# Patient Record
Sex: Female | Born: 1960
Health system: Southern US, Community
[De-identification: ages and names within clinical notes are randomized; demographics above are authoritative.]

## PROBLEM LIST (undated history)

## (undated) DIAGNOSIS — I10 Essential (primary) hypertension: Secondary | ICD-10-CM

## (undated) DIAGNOSIS — D219 Benign neoplasm of connective and other soft tissue, unspecified: Secondary | ICD-10-CM

## (undated) DIAGNOSIS — N83202 Unspecified ovarian cyst, left side: Secondary | ICD-10-CM

## (undated) DIAGNOSIS — M722 Plantar fascial fibromatosis: Secondary | ICD-10-CM

## (undated) DIAGNOSIS — M1990 Unspecified osteoarthritis, unspecified site: Secondary | ICD-10-CM

## (undated) DIAGNOSIS — B009 Herpesviral infection, unspecified: Secondary | ICD-10-CM

## (undated) DIAGNOSIS — R87619 Unspecified abnormal cytological findings in specimens from cervix uteri: Secondary | ICD-10-CM

## (undated) DIAGNOSIS — R87629 Unspecified abnormal cytological findings in specimens from vagina: Secondary | ICD-10-CM

## (undated) DIAGNOSIS — IMO0002 Reserved for concepts with insufficient information to code with codable children: Secondary | ICD-10-CM

## (undated) DIAGNOSIS — J45990 Exercise induced bronchospasm: Secondary | ICD-10-CM

## (undated) DIAGNOSIS — N83201 Unspecified ovarian cyst, right side: Secondary | ICD-10-CM

## (undated) DIAGNOSIS — R9389 Abnormal findings on diagnostic imaging of other specified body structures: Secondary | ICD-10-CM

## (undated) HISTORY — DX: Benign neoplasm of connective and other soft tissue, unspecified: D21.9

## (undated) HISTORY — DX: Unspecified ovarian cyst, right side: N83.201

## (undated) HISTORY — DX: Abnormal findings on diagnostic imaging of other specified body structures: R93.89

## (undated) HISTORY — DX: Unspecified ovarian cyst, left side: N83.202

## (undated) HISTORY — DX: Herpesviral infection, unspecified: B00.9

## (undated) HISTORY — PX: RETINAL LASER PROCEDURE: SHX2339

## (undated) HISTORY — DX: Plantar fascial fibromatosis: M72.2

## (undated) HISTORY — DX: Exercise induced bronchospasm: J45.990

## (undated) HISTORY — PX: LASER ABLATION OF THE CERVIX: SHX1949

## (undated) HISTORY — PX: OTHER SURGICAL HISTORY: SHX169

## (undated) HISTORY — DX: Unspecified abnormal cytological findings in specimens from vagina: R87.629

## (undated) HISTORY — DX: Reserved for concepts with insufficient information to code with codable children: IMO0002

## (undated) HISTORY — PX: CARPAL TUNNEL RELEASE: SHX101

## (undated) HISTORY — DX: Essential (primary) hypertension: I10

## (undated) HISTORY — DX: Unspecified abnormal cytological findings in specimens from cervix uteri: R87.619

---

## 1998-01-27 ENCOUNTER — Encounter: Admission: RE | Admit: 1998-01-27 | Discharge: 1998-04-27 | Payer: Self-pay | Admitting: *Deleted

## 2001-10-20 ENCOUNTER — Other Ambulatory Visit: Admission: RE | Admit: 2001-10-20 | Discharge: 2001-10-20 | Payer: Self-pay | Admitting: *Deleted

## 2003-01-10 ENCOUNTER — Other Ambulatory Visit: Admission: RE | Admit: 2003-01-10 | Discharge: 2003-01-10 | Payer: Self-pay | Admitting: *Deleted

## 2003-01-26 ENCOUNTER — Encounter: Payer: Self-pay | Admitting: *Deleted

## 2003-01-26 ENCOUNTER — Ambulatory Visit (HOSPITAL_COMMUNITY): Admission: RE | Admit: 2003-01-26 | Discharge: 2003-01-26 | Payer: Self-pay | Admitting: *Deleted

## 2004-01-17 ENCOUNTER — Other Ambulatory Visit: Admission: RE | Admit: 2004-01-17 | Discharge: 2004-01-17 | Payer: Self-pay | Admitting: *Deleted

## 2004-10-01 ENCOUNTER — Ambulatory Visit: Payer: Self-pay | Admitting: Sports Medicine

## 2005-01-21 ENCOUNTER — Other Ambulatory Visit: Admission: RE | Admit: 2005-01-21 | Discharge: 2005-01-21 | Payer: Self-pay | Admitting: *Deleted

## 2005-02-27 ENCOUNTER — Ambulatory Visit (HOSPITAL_COMMUNITY): Admission: RE | Admit: 2005-02-27 | Discharge: 2005-02-27 | Payer: Self-pay | Admitting: *Deleted

## 2006-03-31 ENCOUNTER — Ambulatory Visit (HOSPITAL_COMMUNITY): Admission: RE | Admit: 2006-03-31 | Discharge: 2006-03-31 | Payer: Self-pay | Admitting: Obstetrics and Gynecology

## 2007-04-13 ENCOUNTER — Ambulatory Visit (HOSPITAL_COMMUNITY): Admission: RE | Admit: 2007-04-13 | Discharge: 2007-04-13 | Payer: Self-pay | Admitting: Obstetrics and Gynecology

## 2007-07-05 ENCOUNTER — Ambulatory Visit: Payer: Self-pay | Admitting: Orthopedic Surgery

## 2007-07-19 ENCOUNTER — Ambulatory Visit: Payer: Self-pay | Admitting: Orthopedic Surgery

## 2007-08-16 ENCOUNTER — Ambulatory Visit: Payer: Self-pay | Admitting: Orthopedic Surgery

## 2007-09-15 ENCOUNTER — Telehealth (INDEPENDENT_AMBULATORY_CARE_PROVIDER_SITE_OTHER): Payer: Self-pay | Admitting: *Deleted

## 2007-09-15 ENCOUNTER — Ambulatory Visit: Payer: Self-pay | Admitting: Orthopedic Surgery

## 2007-10-07 ENCOUNTER — Other Ambulatory Visit: Admission: RE | Admit: 2007-10-07 | Discharge: 2007-10-07 | Payer: Self-pay | Admitting: Obstetrics and Gynecology

## 2008-04-13 ENCOUNTER — Ambulatory Visit (HOSPITAL_COMMUNITY): Admission: RE | Admit: 2008-04-13 | Discharge: 2008-04-13 | Payer: Self-pay | Admitting: Obstetrics and Gynecology

## 2008-04-19 ENCOUNTER — Other Ambulatory Visit: Admission: RE | Admit: 2008-04-19 | Discharge: 2008-04-19 | Payer: Self-pay | Admitting: Obstetrics and Gynecology

## 2008-04-24 ENCOUNTER — Ambulatory Visit (HOSPITAL_COMMUNITY): Admission: RE | Admit: 2008-04-24 | Discharge: 2008-04-24 | Payer: Self-pay | Admitting: Obstetrics and Gynecology

## 2008-10-30 ENCOUNTER — Ambulatory Visit (HOSPITAL_COMMUNITY): Admission: RE | Admit: 2008-10-30 | Discharge: 2008-10-30 | Payer: Self-pay | Admitting: Obstetrics and Gynecology

## 2009-04-17 ENCOUNTER — Ambulatory Visit (HOSPITAL_COMMUNITY): Admission: RE | Admit: 2009-04-17 | Discharge: 2009-04-17 | Payer: Self-pay | Admitting: Internal Medicine

## 2009-05-02 ENCOUNTER — Other Ambulatory Visit: Admission: RE | Admit: 2009-05-02 | Discharge: 2009-05-02 | Payer: Self-pay | Admitting: Obstetrics and Gynecology

## 2010-04-18 ENCOUNTER — Ambulatory Visit (HOSPITAL_COMMUNITY): Admission: RE | Admit: 2010-04-18 | Discharge: 2010-04-18 | Payer: Self-pay | Admitting: Obstetrics and Gynecology

## 2010-07-03 ENCOUNTER — Other Ambulatory Visit: Admission: RE | Admit: 2010-07-03 | Discharge: 2010-07-03 | Payer: Self-pay | Admitting: Obstetrics and Gynecology

## 2011-04-10 ENCOUNTER — Other Ambulatory Visit: Payer: Self-pay | Admitting: Obstetrics and Gynecology

## 2011-04-10 DIAGNOSIS — Z139 Encounter for screening, unspecified: Secondary | ICD-10-CM

## 2011-04-22 ENCOUNTER — Ambulatory Visit (HOSPITAL_COMMUNITY)
Admission: RE | Admit: 2011-04-22 | Discharge: 2011-04-22 | Disposition: A | Discharge status: Home / Self Care | Payer: 59 | Source: Ambulatory Visit | Attending: Obstetrics and Gynecology | Admitting: Obstetrics and Gynecology

## 2011-04-22 DIAGNOSIS — Z139 Encounter for screening, unspecified: Secondary | ICD-10-CM

## 2011-04-22 DIAGNOSIS — Z1231 Encounter for screening mammogram for malignant neoplasm of breast: Secondary | ICD-10-CM | POA: Insufficient documentation

## 2011-07-30 ENCOUNTER — Other Ambulatory Visit (HOSPITAL_COMMUNITY)
Admission: RE | Admit: 2011-07-30 | Discharge: 2011-07-30 | Disposition: A | Discharge status: Home / Self Care | Payer: 59 | Source: Ambulatory Visit | Attending: Obstetrics and Gynecology | Admitting: Obstetrics and Gynecology

## 2011-07-30 ENCOUNTER — Other Ambulatory Visit: Payer: Self-pay | Admitting: Adult Health

## 2011-07-30 DIAGNOSIS — Z01419 Encounter for gynecological examination (general) (routine) without abnormal findings: Secondary | ICD-10-CM | POA: Insufficient documentation

## 2011-10-02 ENCOUNTER — Encounter: Payer: Self-pay | Admitting: Orthopedic Surgery

## 2011-10-02 ENCOUNTER — Ambulatory Visit (INDEPENDENT_AMBULATORY_CARE_PROVIDER_SITE_OTHER): Payer: 59 | Admitting: Orthopedic Surgery

## 2011-10-02 DIAGNOSIS — M722 Plantar fascial fibromatosis: Secondary | ICD-10-CM

## 2011-10-02 NOTE — Patient Instructions (Signed)
Donut for the area   Activities as tolerated

## 2011-10-02 NOTE — Progress Notes (Signed)
New problem.  This 50 year old female has a lump in the arch of the RIGHT foot. Complaint of discomfort x1 month. She complains of dull, stabbing, 6/10 discomfort, which comes and goes and is worse with exercise and wearing certain shoes. No bruising, numbness, tingling, swelling. Seasonal allergies, and joint pain. No other symptoms on review of systems. History is been reviewed.  Examination of the RIGHT foot. Plantar aspect of the RIGHT foot is a 2 x 2 cm firm nodule in the plantar fascia consistent with plantar fibroma. Ankle range of motion is normal. Muscle tone normal.  X-ray exam. None  Plantar fibromatosis.  Activities as tolerated. Not interested in excision at this time.

## 2012-04-14 ENCOUNTER — Other Ambulatory Visit: Payer: Self-pay | Admitting: Obstetrics and Gynecology

## 2012-04-14 DIAGNOSIS — Z139 Encounter for screening, unspecified: Secondary | ICD-10-CM

## 2012-04-22 ENCOUNTER — Ambulatory Visit (HOSPITAL_COMMUNITY): Payer: 59

## 2012-04-26 ENCOUNTER — Ambulatory Visit (HOSPITAL_COMMUNITY)
Admission: RE | Admit: 2012-04-26 | Discharge: 2012-04-26 | Disposition: A | Discharge status: Home / Self Care | Payer: 59 | Source: Ambulatory Visit | Attending: Obstetrics and Gynecology | Admitting: Obstetrics and Gynecology

## 2012-04-26 DIAGNOSIS — Z1231 Encounter for screening mammogram for malignant neoplasm of breast: Secondary | ICD-10-CM | POA: Insufficient documentation

## 2012-04-26 DIAGNOSIS — Z139 Encounter for screening, unspecified: Secondary | ICD-10-CM

## 2012-08-18 ENCOUNTER — Other Ambulatory Visit (HOSPITAL_COMMUNITY)
Admission: RE | Admit: 2012-08-18 | Discharge: 2012-08-18 | Disposition: A | Discharge status: Home / Self Care | Payer: 59 | Source: Ambulatory Visit | Attending: Obstetrics and Gynecology | Admitting: Obstetrics and Gynecology

## 2012-08-18 ENCOUNTER — Other Ambulatory Visit: Payer: Self-pay | Admitting: Adult Health

## 2012-08-18 DIAGNOSIS — Z1151 Encounter for screening for human papillomavirus (HPV): Secondary | ICD-10-CM | POA: Insufficient documentation

## 2012-08-18 DIAGNOSIS — Z01419 Encounter for gynecological examination (general) (routine) without abnormal findings: Secondary | ICD-10-CM | POA: Insufficient documentation

## 2012-08-19 ENCOUNTER — Other Ambulatory Visit: Payer: Self-pay | Admitting: Adult Health

## 2012-08-19 ENCOUNTER — Ambulatory Visit (HOSPITAL_COMMUNITY)
Admission: RE | Admit: 2012-08-19 | Discharge: 2012-08-19 | Disposition: A | Discharge status: Home / Self Care | Payer: 59 | Source: Ambulatory Visit | Attending: Adult Health | Admitting: Adult Health

## 2012-08-19 DIAGNOSIS — I4902 Ventricular flutter: Secondary | ICD-10-CM | POA: Insufficient documentation

## 2012-08-19 DIAGNOSIS — I498 Other specified cardiac arrhythmias: Secondary | ICD-10-CM

## 2012-08-19 DIAGNOSIS — R5383 Other fatigue: Secondary | ICD-10-CM

## 2012-08-19 DIAGNOSIS — R0602 Shortness of breath: Secondary | ICD-10-CM

## 2012-08-19 DIAGNOSIS — R5381 Other malaise: Secondary | ICD-10-CM | POA: Insufficient documentation

## 2012-09-07 ENCOUNTER — Encounter: Payer: Self-pay | Admitting: *Deleted

## 2012-09-08 ENCOUNTER — Ambulatory Visit (INDEPENDENT_AMBULATORY_CARE_PROVIDER_SITE_OTHER): Payer: 59 | Admitting: Cardiology

## 2012-09-08 ENCOUNTER — Encounter: Payer: Self-pay | Admitting: Cardiology

## 2012-09-08 VITALS — BP 134/83 | HR 75 | Ht 67.0 in | Wt 163.0 lb

## 2012-09-08 DIAGNOSIS — J45990 Exercise induced bronchospasm: Secondary | ICD-10-CM

## 2012-09-08 DIAGNOSIS — R072 Precordial pain: Secondary | ICD-10-CM | POA: Insufficient documentation

## 2012-09-08 NOTE — Assessment & Plan Note (Signed)
Never formally assessed. If stress echocardiogram is normal, may need to consider further w/u with Dr. Gerda Diss.

## 2012-09-08 NOTE — Patient Instructions (Addendum)
Your physician recommends that you schedule a follow-up appointment in: We will call you with Results  Your physician has requested that you have a stress echocardiogram. For further information please visit [email or web address] Please follow instruction sheet as given.

## 2012-09-08 NOTE — Assessment & Plan Note (Signed)
Atypical in that it is sporatic, although does describe a tightness. Has only mild increase in cholesterol, no history of HTN or DM2, premature CAD noted in father. ECG is normal today. Will obtain objective ischemic testing via exercise echocardiogram.

## 2012-09-08 NOTE — Progress Notes (Signed)
Clinical Summary Sarah Mason is a 51 y.o.female referred for cardiology consultation by Sarah Mourning, NP. Her primary care physician is Sarah Mason. She describes a history of intermittent chest tightness over the summer, not purely exertional, in fact more sporatic in nature. She has also had some dyspnea on exertion with high levels of exertion such as run-walk and some aerobics.  Chest x-ray from 8/29 reported no acute abnormalities. Recent lab work reviewed showing normal TSH of 1.8, hemoglobin 13.4, potassium 4.5, BUN 17, creatinine 0.7, total cholesterol 220, triglycerides 96, HDL 65, LDL 136. ECG today shows normal sinus rhythm.  She states that she had two prior episodes of wheezing after exercise, thought it might be exercise induced asthma, although this has not been specifically tested. Also mentioned possible reflux symptoms.   Allergies  Allergen Reactions  . Doxycycline     Current Outpatient Prescriptions  Medication Sig Dispense Refill  . aspirin 81 MG tablet Take 81 mg by mouth daily.      . cetirizine (ZYRTEC) 10 MG tablet Take 10 mg by mouth daily.        . Multiple Vitamin (MULTIVITAMIN) tablet Take 1 tablet by mouth daily.        . valACYclovir (VALTREX) 500 MG tablet Take 500 mg by mouth as needed.        Past Medical History  Diagnosis Date  . Fibroids   . Exercise-induced asthma   . HSV (herpes simplex virus) infection   . Plantar fibromatosis     Past Surgical History  Procedure Date  . Dilatation and curettage   . Laser ablation of the cervix     Family History  Problem Relation Age of Onset  . Heart disease      Maternal grandmother  . Arthritis      Maternal and paternal grandparetns  . Lung disease      Maternal and paternal grandparents  . Diabetes Father   . Diabetes Mother   . Hypertension Mother   . Hypertension Father   . Hyperlipidemia Father   . Hyperlipidemia Mother   . Heart attack Father     In his 80's    Social  History Sarah Mason reports that she has quit smoking. Her smoking use included Cigarettes. She does not have any smokeless tobacco history on file. Sarah Mason reports that she drinks alcohol.  Review of Systems No palpitations or syncope. No orthopnea or PND. No edema or claudication.  Physical Examination Filed Vitals:   09/08/12 1021  BP: 134/83  Pulse: 75   Filed Weights   09/08/12 1021  Weight: 163 lb (73.936 kg)   Patient in NAD. HEENT: Conjunctiva and lids normal, oropharynx clear. Neck: Supple, no elevated JVP or carotid bruits, no thyromegaly. Lungs: Clear to auscultation, nonlabored breathing at rest. Cardiac: Regular rate and rhythm, no S3 or significant systolic murmur, no pericardial rub. Abdomen: Soft, nontender, bowel sounds present, no guarding or rebound. Extremities: No pitting edema, distal pulses 2+. Skin: Warm and dry. Musculoskeletal: No kyphosis. Neuropsychiatric: Alert and oriented x3, affect grossly appropriate.   Problem List and Plan   Precordial pain Atypical in that it is sporatic, although does describe a tightness. Has only mild increase in cholesterol, no history of HTN or DM2, premature CAD noted in father. ECG is normal today. Will obtain objective ischemic testing via exercise echocardiogram.  Exercise-induced asthma Never formally assessed. If stress echocardiogram is normal, may need to consider further w/u with Sarah Mason.  Satira Sark, M.D., F.A.C.C.

## 2012-09-16 ENCOUNTER — Ambulatory Visit (HOSPITAL_COMMUNITY)
Admission: RE | Admit: 2012-09-16 | Discharge: 2012-09-16 | Disposition: A | Discharge status: Home / Self Care | Payer: 59 | Source: Ambulatory Visit | Attending: Cardiology | Admitting: Cardiology

## 2012-09-16 ENCOUNTER — Encounter (HOSPITAL_COMMUNITY): Payer: Self-pay | Admitting: Cardiology

## 2012-09-16 DIAGNOSIS — R072 Precordial pain: Secondary | ICD-10-CM

## 2012-09-16 DIAGNOSIS — J45990 Exercise induced bronchospasm: Secondary | ICD-10-CM

## 2012-09-16 DIAGNOSIS — R079 Chest pain, unspecified: Secondary | ICD-10-CM | POA: Insufficient documentation

## 2012-09-16 DIAGNOSIS — R0989 Other specified symptoms and signs involving the circulatory and respiratory systems: Secondary | ICD-10-CM | POA: Insufficient documentation

## 2012-09-16 DIAGNOSIS — R0609 Other forms of dyspnea: Secondary | ICD-10-CM | POA: Insufficient documentation

## 2012-09-16 NOTE — Progress Notes (Signed)
Stress Lab Nurses Notes - Sarah Mason  Sarah Mason 09/16/2012 Reason for doing test: Precordial pain & Dyspnea Type of test: Stress Echo Nurse performing test: Parke Poisson, RN Nuclear Medicine Tech: Not Applicable Echo Tech: Nestor Ramp MD performing test: R. Dietrich Pates Family MD: Lubertha South Test explained and consent signed: yes IV started: No IV started Symptoms: None Treatment/Intervention: None Reason test stopped: fatigue and reached target HR After recovery IV was: None Patient to return to Nuc. Med at : NA Patient discharged: Home Patient's Condition upon discharge was: stable Comments: During test peak BP 188/83 & HR 164.  Recovery BP 145/82 & HR 80.  Symptoms resolved in recovery.                                                                                                                                                                                                         Sarah Mason

## 2012-09-16 NOTE — Progress Notes (Signed)
*  PRELIMINARY RESULTS* Echocardiogram Echocardiogram Stress Test has been performed.  Nestor Ramp M 09/16/2012, 9:51 AM

## 2013-04-06 ENCOUNTER — Other Ambulatory Visit: Payer: Self-pay | Admitting: Obstetrics and Gynecology

## 2013-04-06 DIAGNOSIS — Z139 Encounter for screening, unspecified: Secondary | ICD-10-CM

## 2013-04-28 ENCOUNTER — Ambulatory Visit (HOSPITAL_COMMUNITY)
Admission: RE | Admit: 2013-04-28 | Discharge: 2013-04-28 | Disposition: A | Discharge status: Home / Self Care | Payer: 59 | Source: Ambulatory Visit | Attending: Obstetrics and Gynecology | Admitting: Obstetrics and Gynecology

## 2013-04-28 DIAGNOSIS — Z1231 Encounter for screening mammogram for malignant neoplasm of breast: Secondary | ICD-10-CM | POA: Insufficient documentation

## 2013-04-28 DIAGNOSIS — Z139 Encounter for screening, unspecified: Secondary | ICD-10-CM

## 2013-05-16 ENCOUNTER — Other Ambulatory Visit: Payer: Self-pay | Admitting: Adult Health

## 2013-08-30 ENCOUNTER — Ambulatory Visit (INDEPENDENT_AMBULATORY_CARE_PROVIDER_SITE_OTHER): Payer: 59 | Admitting: Adult Health

## 2013-08-30 ENCOUNTER — Encounter: Payer: Self-pay | Admitting: Adult Health

## 2013-08-30 ENCOUNTER — Other Ambulatory Visit (HOSPITAL_COMMUNITY)
Admission: RE | Admit: 2013-08-30 | Discharge: 2013-08-30 | Disposition: A | Discharge status: Home / Self Care | Payer: 59 | Source: Ambulatory Visit | Attending: Adult Health | Admitting: Adult Health

## 2013-08-30 VITALS — BP 110/68 | HR 70 | Ht 67.0 in | Wt 161.0 lb

## 2013-08-30 DIAGNOSIS — Z1212 Encounter for screening for malignant neoplasm of rectum: Secondary | ICD-10-CM

## 2013-08-30 DIAGNOSIS — Z1151 Encounter for screening for human papillomavirus (HPV): Secondary | ICD-10-CM | POA: Insufficient documentation

## 2013-08-30 DIAGNOSIS — Z01419 Encounter for gynecological examination (general) (routine) without abnormal findings: Secondary | ICD-10-CM

## 2013-08-30 LAB — HEMOCCULT GUIAC POC 1CARD (OFFICE): Fecal Occult Blood, POC: NEGATIVE

## 2013-08-30 NOTE — Patient Instructions (Addendum)
Physical in 1 year Mammogram yearly Labs next week

## 2013-08-30 NOTE — Progress Notes (Signed)
Patient ID: Sarah Mason, female   DOB: October 22, 1961, 52 y.o.   MRN: [id number] History of Present Illness: Sarah Mason is a 52 year old white female married in for pap and physical.   Current Medications, Allergies, Past Medical History, Past Surgical History, Family History and Social History were reviewed in [employer] electronic medical record.     Review of Systems: Patient denies any blurred vision, shortness of breath, chest pain, abdominal pain, problems with bowel movements, urination, or intercourse.No joint pains, she did have headache with last menses,no mood changes and she stopped her pill about 3 weeks age. She is active and works out at [employer].Has some GERD like symptoms and Prilosec helps.    Physical Exam:BP 110/68  Pulse 70  Ht 5\' 7"  (1.702 m)  Wt 161 lb (73.029 kg)  BMI 25.21 kg/m2  LMP 08/08/2013 General:  Well developed, well nourished, no acute distress Skin:  Warm and dry Neck:  Midline trachea, normal thyroid Lungs; Clear to auscultation bilaterally Breast:  No dominant palpable mass, retraction, or nipple discharge Cardiovascular: Regular rate and rhythm Abdomen:  Soft, non tender, no hepatosplenomegaly Pelvic:  External genitalia is normal in appearance.  The vagina is normal in appearance.  The cervix is bulbous with nabothian cyst and pap performed with HPV.  Uterus is felt to be normal size, shape, and contour.  No adnexal masses or tenderness noted. Rectal: Good sphincter tone, no polyps, or hemorrhoids felt.  Hemoccult negative. Extremities:  No swelling or varicosities noted Psych: Alert and cooperative ,seems happy   Impression: Yearly exam     Plan: Physical in 1 year Mammogram yearly  Check CBC,CMP,TSH and lipids and FSH next week fasting

## 2013-09-08 ENCOUNTER — Other Ambulatory Visit: Payer: Self-pay | Admitting: Adult Health

## 2013-09-08 LAB — CBC
HCT: 41.5 % (ref 36.0–46.0)
Hemoglobin: 13.8 g/dL (ref 12.0–15.0)
MCH: 26.6 pg (ref 26.0–34.0)
MCHC: 33.3 g/dL (ref 30.0–36.0)
MCV: 80.1 fL (ref 78.0–100.0)
Platelets: 186 K/uL (ref 150–400)
RBC: 5.18 MIL/uL — ABNORMAL HIGH (ref 3.87–5.11)
RDW: 15.7 % — ABNORMAL HIGH (ref 11.5–15.5)
WBC: 3.3 K/uL — ABNORMAL LOW (ref 4.0–10.5)

## 2013-09-08 LAB — LIPID PANEL
Cholesterol: 174 mg/dL (ref 0–200)
HDL: 54 mg/dL
LDL Cholesterol: 104 mg/dL — ABNORMAL HIGH (ref 0–99)
Total CHOL/HDL Ratio: 3.2 ratio
Triglycerides: 82 mg/dL
VLDL: 16 mg/dL (ref 0–40)

## 2013-09-08 LAB — COMPREHENSIVE METABOLIC PANEL
ALT: 19 U/L (ref 0–35)
Alkaline Phosphatase: 56 U/L (ref 39–117)
Sodium: 139 mEq/L (ref 135–145)
Total Bilirubin: 0.6 mg/dL (ref 0.3–1.2)
Total Protein: 6.7 g/dL (ref 6.0–8.3)

## 2013-09-09 ENCOUNTER — Telehealth: Payer: Self-pay | Admitting: Adult Health

## 2013-09-09 NOTE — Telephone Encounter (Signed)
Left message about labs

## 2013-10-12 ENCOUNTER — Other Ambulatory Visit: Payer: 59

## 2013-10-12 DIAGNOSIS — Z0000 Encounter for general adult medical examination without abnormal findings: Secondary | ICD-10-CM

## 2013-10-12 DIAGNOSIS — R899 Unspecified abnormal finding in specimens from other organs, systems and tissues: Secondary | ICD-10-CM

## 2013-10-12 DIAGNOSIS — Z1329 Encounter for screening for other suspected endocrine disorder: Secondary | ICD-10-CM

## 2013-10-12 DIAGNOSIS — Z13220 Encounter for screening for lipoid disorders: Secondary | ICD-10-CM

## 2013-10-12 LAB — CBC
MCHC: 33.6 g/dL (ref 30.0–36.0)
MCV: 84.8 fL (ref 78.0–100.0)
Platelets: 200 10*3/uL (ref 150–400)
RDW: 14.7 % (ref 11.5–15.5)
WBC: 5.7 10*3/uL (ref 4.0–10.5)

## 2013-10-12 NOTE — Addendum Note (Signed)
Addended by: Colen Darling on: 10/12/2013 10:26 AM   Modules accepted: Orders

## 2013-10-13 ENCOUNTER — Telehealth: Payer: Self-pay | Admitting: Adult Health

## 2013-10-13 NOTE — Telephone Encounter (Signed)
Left message wbc normal

## 2013-10-22 ENCOUNTER — Encounter: Payer: Self-pay | Admitting: *Deleted

## 2013-10-24 ENCOUNTER — Ambulatory Visit (INDEPENDENT_AMBULATORY_CARE_PROVIDER_SITE_OTHER): Payer: 59 | Admitting: Family Medicine

## 2013-10-24 ENCOUNTER — Other Ambulatory Visit: Payer: Self-pay

## 2013-10-24 ENCOUNTER — Telehealth: Payer: Self-pay

## 2013-10-24 ENCOUNTER — Encounter: Payer: Self-pay | Admitting: Family Medicine

## 2013-10-24 VITALS — BP 122/82 | Ht 67.0 in | Wt 162.0 lb

## 2013-10-24 DIAGNOSIS — J069 Acute upper respiratory infection, unspecified: Secondary | ICD-10-CM

## 2013-10-24 DIAGNOSIS — Z1211 Encounter for screening for malignant neoplasm of colon: Secondary | ICD-10-CM

## 2013-10-24 DIAGNOSIS — Z23 Encounter for immunization: Secondary | ICD-10-CM

## 2013-10-24 NOTE — Progress Notes (Signed)
  Subjective:    Patient ID: Sarah Mason, female    DOB: [birth year], 52 y.o.   MRN: [id number]  HPI Patient arrives because her my chart stated she needed a tetanus and would also like a flu shot. Patient with slight runny nose cough denies fever chills sweats no nausea vomiting diarrhea she also wants tetanus update as well as flu shot we also discussed colonoscopy. Review of Systems Slight runny nose no wheezing difficulty breathing nausea vomiting no fever    Objective:   Physical Exam Eardrums normal throat is normal neck supple lungs clear heart regular       Assessment & Plan:  Patient will set up a colonoscopy with her GI provider she will let us know she needs any help  Tetanus shot given today, flu shot given today  Viral URI

## 2013-10-25 NOTE — Telephone Encounter (Signed)
Gastroenterology Pre-Procedure Review  Request Date: 10/24/2013 Requesting Physician: Dr. Lilyan Punt  PATIENT REVIEW QUESTIONS: The patient responded to the following health history questions as indicated:    1. Diabetes Melitis: no 2. Joint replacements in the past 12 months: no 3. Major health problems in the past 3 months: no 4. Has an artificial valve or MVP: no 5. Has a defibrillator: no 6. Has been advised in past to take antibiotics in advance of a procedure like teeth cleaning: no    MEDICATIONS & ALLERGIES:    Patient reports the following regarding taking any blood thinners:   Plavix? no Aspirin? no Coumadin? no  Patient confirms/reports the following medications:  Current Outpatient Prescriptions  Medication Sig Dispense Refill  . fexofenadine (ALLEGRA) 180 MG tablet Take 180 mg by mouth daily.      . Multiple Vitamin (MULTIVITAMIN) tablet Take 1 tablet by mouth daily.        . valACYclovir (VALTREX) 500 MG tablet Take 500 mg by mouth as needed.      Marland Kitchen aspirin 81 MG tablet Take 81 mg by mouth daily.      Marland Kitchen VIORELE 0.15-0.02/0.01 MG (21/5) tablet take 1 tablet by mouth once daily  28 tablet  12   No current facility-administered medications for this visit.    Patient confirms/reports the following allergies:  Allergies  Allergen Reactions  . Doxycycline     No orders of the defined types were placed in this encounter.    AUTHORIZATION INFORMATION Primary Insurance:   ID #:   Group #:  Pre-Cert / Auth required: Pre-Cert / Auth #:   Secondary Insurance:   ID #:   Group #:  Pre-Cert / Auth required: Pre-Cert / Auth #:   SCHEDULE INFORMATION: Procedure has been scheduled as follows:  Date:  11/14/2013       Time:  10:30 AM Location: [facility] Short Stay  This Gastroenterology Pre-Precedure Review Form is being routed to the following provider(s): Jonette Eva, MD

## 2013-10-26 NOTE — Telephone Encounter (Signed)
PREPOPIK-DRINK WATER TO KEEP URINE LIGHT YELLOW.  PT SHOULD DROP OFF RX 3 DAYS PRIOR TO PROCEDURE.  

## 2013-10-27 MED ORDER — SOD PICOSULFATE-MAG OX-CIT ACD 10-3.5-12 MG-GM-GM PO PACK: 1.0000 | PACK | ORAL | Status: DC

## 2013-10-27 NOTE — Telephone Encounter (Signed)
Rx sent to the pharmacy and instructions mailed to pt.  

## 2013-11-01 ENCOUNTER — Encounter (HOSPITAL_COMMUNITY): Payer: Self-pay | Admitting: Pharmacy Technician

## 2013-11-08 ENCOUNTER — Telehealth: Payer: Self-pay

## 2013-11-08 NOTE — Telephone Encounter (Signed)
I called Sarah Mason at [facility] at [phone] and PA is not required for screening colonoscopy.

## 2013-11-14 ENCOUNTER — Ambulatory Visit (HOSPITAL_COMMUNITY)
Admission: RE | Admit: 2013-11-14 | Discharge: 2013-11-14 | Disposition: A | Discharge status: Home / Self Care | Payer: 59 | Source: Ambulatory Visit | Attending: Gastroenterology | Admitting: Gastroenterology

## 2013-11-14 ENCOUNTER — Encounter (HOSPITAL_COMMUNITY): Payer: Self-pay | Admitting: *Deleted

## 2013-11-14 ENCOUNTER — Encounter (HOSPITAL_COMMUNITY)
Admission: RE | Disposition: A | Discharge status: Home / Self Care | Payer: Self-pay | Source: Ambulatory Visit | Attending: Gastroenterology

## 2013-11-14 DIAGNOSIS — K648 Other hemorrhoids: Secondary | ICD-10-CM

## 2013-11-14 DIAGNOSIS — K620 Anal polyp: Secondary | ICD-10-CM

## 2013-11-14 DIAGNOSIS — Z1211 Encounter for screening for malignant neoplasm of colon: Secondary | ICD-10-CM | POA: Insufficient documentation

## 2013-11-14 DIAGNOSIS — K621 Rectal polyp: Secondary | ICD-10-CM

## 2013-11-14 HISTORY — PX: COLONOSCOPY: SHX5424

## 2013-11-14 SURGERY — COLONOSCOPY
Anesthesia: Moderate Sedation

## 2013-11-14 MED ORDER — MIDAZOLAM HCL 5 MG/5ML IJ SOLN
INTRAMUSCULAR | Status: AC
  Filled 2013-11-14: qty 10

## 2013-11-14 MED ORDER — FENTANYL CITRATE 0.05 MG/ML IJ SOLN
INTRAMUSCULAR | Status: DC | PRN
  Administered 2013-11-14 (×3): 25 ug via INTRAVENOUS

## 2013-11-14 MED ORDER — SODIUM CHLORIDE 0.9 % IV SOLN
INTRAVENOUS | Status: DC
  Administered 2013-11-14: 1000 mL via INTRAVENOUS

## 2013-11-14 MED ORDER — MIDAZOLAM HCL 5 MG/5ML IJ SOLN
INTRAMUSCULAR | Status: DC | PRN
  Administered 2013-11-14: 2 mg via INTRAVENOUS
  Administered 2013-11-14 (×2): 1 mg via INTRAVENOUS
  Administered 2013-11-14: 2 mg via INTRAVENOUS

## 2013-11-14 MED ORDER — MEPERIDINE HCL 100 MG/ML IJ SOLN
INTRAMUSCULAR | Status: AC
  Filled 2013-11-14: qty 2

## 2013-11-14 MED ORDER — FENTANYL CITRATE 0.05 MG/ML IJ SOLN
INTRAMUSCULAR | Status: AC
  Filled 2013-11-14: qty 2

## 2013-11-14 MED ORDER — STERILE WATER FOR IRRIGATION IR SOLN
Status: DC | PRN
  Administered 2013-11-14: 11:00:00

## 2013-11-14 NOTE — H&P (Signed)
  Primary Care Physician:  Harlow Asa, MD Primary Gastroenterologist:  Dr. Darrick Penna  Pre-Procedure History & Physical: HPI:  Sarah Mason is a 52 y.o. female here for COLON CANCER SCREENING.  Past Medical History  Diagnosis Date  . Fibroids   . Exercise-induced asthma   . HSV (herpes simplex virus) infection   . Plantar fibromatosis   . Abnormal Pap smear     Past Surgical History  Procedure Laterality Date  . Dilatation and curettage    . Laser ablation of the cervix      Prior to Admission medications   Medication Sig Start Date End Date Taking? Authorizing Provider  fexofenadine (ALLEGRA) 180 MG tablet Take 180 mg by mouth daily.   Yes Historical Provider, MD  Multiple Vitamin (MULTIVITAMIN) tablet Take 1 tablet by mouth daily.     Yes Historical Provider, MD    Allergies as of 10/24/2013 - Review Complete 10/24/2013  Allergen Reaction Noted  . Doxycycline  10/02/2011    Family History  Problem Relation Age of Onset  . Diabetes Father   . Hypertension Father   . Hyperlipidemia Father   . Heart attack Father     In his 2's  . Lung disease Father     copd  . Diabetes Mother   . Hypertension Mother   . Hyperlipidemia Mother   . Arthritis Paternal Grandfather   . Arthritis Paternal Grandmother   . Heart disease Maternal Grandmother   . Arthritis Maternal Grandmother   . Arthritis Maternal Grandfather   . Lung disease Maternal Grandfather     History   Social History  . Marital Status: Married    Spouse Name: N/A    Number of Children: N/A  . Years of Education: college 4y   Occupational History  . Homemaker    Social History Main Topics  . Smoking status: Former Smoker    Types: Cigarettes  . Smokeless tobacco: Never Used     Comment: Only in highschool  . Alcohol Use: Yes     Comment: One to two drinks per week  . Drug Use: No  . Sexual Activity: Yes    Birth Control/ Protection: None   Other Topics Concern  . Not on file   Social History  Narrative  . No narrative on file    Review of Systems: See HPI, otherwise negative ROS   Physical Exam: BP 147/68  Pulse 66  Temp(Src) 97.7 F (36.5 C) (Oral)  Resp 21  Ht 5\' 7"  (1.702 m)  Wt 155 lb (70.308 kg)  BMI 24.27 kg/m2  SpO2 98%  LMP 08/14/2013 General:   Alert,  pleasant and cooperative in NAD Head:  Normocephalic and atraumatic. Neck:  Supple; Lungs:  Clear throughout to auscultation.    Heart:  Regular rate and rhythm. Abdomen:  Soft, nontender and nondistended. Normal bowel sounds, without guarding, and without rebound.   Neurologic:  Alert and  oriented x4;  grossly normal neurologically.  Impression/Plan:     SCREENING  Plan:  1. TCS TODAY

## 2013-11-14 NOTE — Op Note (Signed)
[facility] [address] [place], [address]   COLONOSCOPY PROCEDURE REPORT  PATIENT: Sarah Mason, Sarah Mason  MR#: [id number] BIRTHDATE: July 16, 1961 , 52  yrs. old GENDER: Female ENDOSCOPIST: Jonette Eva, MD REFERRED [id number] Gerda Diss, M.D. PROCEDURE DATE:  11/14/2013 PROCEDURE:   Colonoscopy with cold biopsy polypectomy INDICATIONS:Colorectal cancer screening.  BLEEDS EASILY. MEDICATIONS: Fentanyl 75 mcg IV, and Versed 6 mg IV  DESCRIPTION OF PROCEDURE:    Physical exam was performed.  Informed consent was obtained from the patient after explaining the benefits, risks, and alternatives to procedure.  The patient was connected to monitor and placed in left lateral position. Continuous oxygen was provided by nasal cannula and IV medicine administered through an indwelling cannula.  After administration of sedation and rectal exam, the patients rectum was intubated and the EC-[id number] ([id number])  colonoscope was advanced under direct visualization to the ileum.  The scope was removed slowly by carefully examining the color, texture, anatomy, and integrity mucosa on the way out.  The patient was recovered in endoscopy and discharged home in satisfactory condition.    COLON FINDINGS: The mucosa appeared normal in the terminal ileum.  , Two sessile polyps measuring 2-3 mm in size were found in the rectum.  A polypectomy was performed with cold forceps.  , The colon mucosa was otherwise normal.  , and The colon Is SLIGHTLY redundant.  Manual abdominal counter-pressure was used to reach the cecum.    MODERATE INTERNAL HEMORRHOIDS.  PREP QUALITY: excellent.  CECAL W/D TIME: 12 minutes COMPLICATIONS: None  ENDOSCOPIC IMPRESSION: 1.   MODERATE INTERNAL HEMORRHOIDS 2.   Two RECTAL POLYPS REMOVED 3.   The colon Is SLIGHTLY redundant  RECOMMENDATIONS: AWAIT BIOPSY HIGH FIBER DIET TCS IN 10 YEARS       _______________________________ eSignedJonette Eva, MD  11/14/2013 11:29 AM

## 2013-11-17 ENCOUNTER — Telehealth: Payer: Self-pay | Admitting: Gastroenterology

## 2013-11-17 NOTE — Telephone Encounter (Signed)
Please call pt. She had HYPERPLASTIC POLYPS removed from her RECTUM. FOLLOW A HIGH FIBER DIET. TCS in 10 years.

## 2013-11-21 ENCOUNTER — Encounter (HOSPITAL_COMMUNITY): Payer: Self-pay | Admitting: Gastroenterology

## 2013-11-21 NOTE — Telephone Encounter (Signed)
Recall in EPIC for TCS.

## 2013-11-21 NOTE — Telephone Encounter (Signed)
Called and informed pt.  

## 2014-05-23 ENCOUNTER — Other Ambulatory Visit: Payer: Self-pay | Admitting: Adult Health

## 2014-05-23 DIAGNOSIS — Z1231 Encounter for screening mammogram for malignant neoplasm of breast: Secondary | ICD-10-CM

## 2014-05-29 ENCOUNTER — Ambulatory Visit (HOSPITAL_COMMUNITY)
Admission: RE | Admit: 2014-05-29 | Discharge: 2014-05-29 | Disposition: A | Discharge status: Home / Self Care | Payer: BC Managed Care – PPO | Source: Ambulatory Visit | Attending: Adult Health | Admitting: Adult Health

## 2014-05-29 DIAGNOSIS — Z1231 Encounter for screening mammogram for malignant neoplasm of breast: Secondary | ICD-10-CM | POA: Insufficient documentation

## 2014-10-23 ENCOUNTER — Encounter (HOSPITAL_COMMUNITY): Payer: Self-pay | Admitting: Gastroenterology

## 2014-11-29 ENCOUNTER — Encounter: Payer: Self-pay | Admitting: Adult Health

## 2014-11-29 ENCOUNTER — Ambulatory Visit (INDEPENDENT_AMBULATORY_CARE_PROVIDER_SITE_OTHER): Payer: BC Managed Care – PPO | Admitting: Adult Health

## 2014-11-29 VITALS — BP 130/80 | HR 74 | Ht 68.0 in | Wt 165.0 lb

## 2014-11-29 DIAGNOSIS — Z01419 Encounter for gynecological examination (general) (routine) without abnormal findings: Secondary | ICD-10-CM

## 2014-11-29 DIAGNOSIS — Z1212 Encounter for screening for malignant neoplasm of rectum: Secondary | ICD-10-CM

## 2014-11-29 DIAGNOSIS — R58 Hemorrhage, not elsewhere classified: Secondary | ICD-10-CM

## 2014-11-29 LAB — HEMOCCULT GUIAC POC 1CARD (OFFICE): Fecal Occult Blood, POC: NEGATIVE

## 2014-11-29 NOTE — Patient Instructions (Signed)
Return in 1 week for [place] and see me Mammogram yearly Colonoscopy per GI Physical in 1 year

## 2014-11-29 NOTE — Progress Notes (Signed)
Patient ID: Sarah Mason, female   DOB: 09/08/1961, 53 y.o.   MRN: [id number] History of Present Illness: Sarah Mason is a 53 year old white female, married in for gyn exam.She had a normal pap with negative HPV 08/30/13.She is having period like bleeding again.She stopped OCs last year and had a [place] of 54.4 08/2013.Has some SUI symptoms at times.   Current Medications, Allergies, Past Medical History, Past Surgical History, Family History and Social History were reviewed in [employer] record.     Review of Systems: Patient denies any headaches, blurred vision, shortness of breath, chest pain, abdominal pain, problems with bowel movements, urination, or intercourse. No joint pain or mood swings,see HPI.    Physical Exam:BP 130/80 mmHg  Pulse 74  Ht 5\' 8"  (1.727 m)  Wt 165 lb (74.844 kg)  BMI 25.09 kg/m2  LMP 12/09/2015General:  Well developed, well nourished, no acute distress Skin:  Warm and dry Neck:  Midline trachea, normal thyroid Lungs; Clear to auscultation bilaterally Breast:  No dominant palpable mass, retraction, or nipple discharge Cardiovascular: Regular rate and rhythm Abdomen:  Soft, non tender, no hepatosplenomegaly Pelvic:  External genitalia is normal in appearance.  The vagina has period like blood.  The cervix is bulbous.  Uterus is felt to be normal size, shape, and contour.  No    adnexal masses or tenderness noted. Rectal: Good sphincter tone, no polyps, or hemorrhoids felt.  Hemoccult negative. Extremities:  No swelling or varicosities noted Psych:  No mood changes,alert and cooperative,seems happy   Impression: Well woman gyn exam no pap Bleeding ?PM    Plan: Return in 1 week for gyn [place] and see me  Physical in 1 year Mammogram yearly Colonoscopy per GI had one last year Try tampons with jumping and running, decrease caffeine and void often

## 2014-12-08 ENCOUNTER — Ambulatory Visit (INDEPENDENT_AMBULATORY_CARE_PROVIDER_SITE_OTHER): Payer: BC Managed Care – PPO

## 2014-12-08 ENCOUNTER — Ambulatory Visit (INDEPENDENT_AMBULATORY_CARE_PROVIDER_SITE_OTHER): Payer: BC Managed Care – PPO | Admitting: Adult Health

## 2014-12-08 ENCOUNTER — Encounter: Payer: Self-pay | Admitting: Adult Health

## 2014-12-08 VITALS — BP 142/92 | Ht 68.0 in | Wt 165.0 lb

## 2014-12-08 DIAGNOSIS — R58 Hemorrhage, not elsewhere classified: Secondary | ICD-10-CM

## 2014-12-08 DIAGNOSIS — N83201 Unspecified ovarian cyst, right side: Secondary | ICD-10-CM

## 2014-12-08 DIAGNOSIS — R9389 Abnormal findings on diagnostic imaging of other specified body structures: Secondary | ICD-10-CM

## 2014-12-08 DIAGNOSIS — N8320 Unspecified ovarian cysts: Secondary | ICD-10-CM

## 2014-12-08 DIAGNOSIS — N83202 Unspecified ovarian cyst, left side: Secondary | ICD-10-CM

## 2014-12-08 DIAGNOSIS — R938 Abnormal findings on diagnostic imaging of other specified body structures: Secondary | ICD-10-CM

## 2014-12-08 HISTORY — DX: Abnormal findings on diagnostic imaging of other specified body structures: R93.89

## 2014-12-08 HISTORY — DX: Unspecified ovarian cyst, right side: N83.201

## 2014-12-08 NOTE — Progress Notes (Signed)
Subjective:     Patient ID: Sarah Mason, female   DOB: 20-May-1961, 53 y.o.   MRN: [id number]  HPI Sarah Mason is a  53 year old white female married, in for [place] for bleeding like a period, ?PM, had [place] 54.4 08/2013.  Review of Systems See HPI Reviewed past medical,surgical, social and family history. Reviewed medications and allergies.     Objective:   Physical Exam BP 142/92 mmHg  Ht 5\' 8"  (1.727 m)  Wt 165 lb (74.844 kg)  BMI 25.09 kg/m2  LMP 12/09/2015Reviewed [place] with pt, and discussed with Dr Elonda Husky.  Uterus 7.0 x 5.2 x 5.0 cm, anteverted with multiple fibroids noted calcified, largest fibroid=1.5cm  Endometrium 7.2 mm, symmetrical, no obvious solitary mass noted within the caivty  Right ovary 2.7 x 1.9 x 1.9 cm, 1.8cm complex cystic area noted with internal debris noted within no +Doppler noted within  Left ovary 4.0 x 3.6 x 2.8 cm, with 3.1 x 2.3cm simple cyst noted with a smaller cyst noted adjacent = 1.5 x 0.8cm   No free fluid noted within the pelvis  Technician Comments:  Anteverted uterus noted with multiple fibroids noted, Endom-7.94mm, bilateral ovarian cystic masses noted, no free fluid or adnexal masses noted within the pelvis    Will schedule endo biopsy and will get CA 125 today.  Assessment:    Thickened endometrium Bilateral ovarian cysts Bleeding ?PM    Plan:     Check CA 125 Return 1/5 for endo biopsy with Dr Elonda Husky Review handout on ovarian cysts and endo biopsy

## 2014-12-08 NOTE — Patient Instructions (Signed)
Ovarian Cyst An ovarian cyst is a fluid-filled sac that forms on an ovary. The ovaries are small organs that produce eggs in women. Various types of cysts can form on the ovaries. Most are not cancerous. Many do not cause problems, and they often go away on their own. Some may cause symptoms and require treatment. Common types of ovarian cysts include:  Functional cysts--These cysts may occur every month during the menstrual cycle. This is normal. The cysts usually go away with the next menstrual cycle if the woman does not get pregnant. Usually, there are no symptoms with a functional cyst.  Endometrioma cysts--These cysts form from the tissue that lines the uterus. They are also called "chocolate cysts" because they become filled with blood that turns brown. This type of cyst can cause pain in the lower abdomen during intercourse and with your menstrual period.  Cystadenoma cysts--This type develops from the cells on the outside of the ovary. These cysts can get very big and cause lower abdomen pain and pain with intercourse. This type of cyst can twist on itself, cut off its blood supply, and cause severe pain. It can also easily rupture and cause a lot of pain.  Dermoid cysts--This type of cyst is sometimes found in both ovaries. These cysts may contain different kinds of body tissue, such as skin, teeth, hair, or cartilage. They usually do not cause symptoms unless they get very big.  Theca lutein cysts--These cysts occur when too much of a certain hormone (human chorionic gonadotropin) is produced and overstimulates the ovaries to produce an egg. This is most common after procedures used to assist with the conception of a baby (in vitro fertilization). CAUSES   Fertility drugs can cause a condition in which multiple large cysts are formed on the ovaries. This is called ovarian hyperstimulation syndrome.  A condition called polycystic ovary syndrome can cause hormonal imbalances that can lead to  nonfunctional ovarian cysts. SIGNS AND SYMPTOMS  Many ovarian cysts do not cause symptoms. If symptoms are present, they may include:  Pelvic pain or pressure.  Pain in the lower abdomen.  Pain during sexual intercourse.  Increasing girth (swelling) of the abdomen.  Abnormal menstrual periods.  Increasing pain with menstrual periods.  Stopping having menstrual periods without being pregnant. DIAGNOSIS  These cysts are commonly found during a routine or annual pelvic exam. Tests may be ordered to find out more about the cyst. These tests may include:  Ultrasound.  X-ray of the pelvis.  CT scan.  MRI.  Blood tests. TREATMENT  Many ovarian cysts go away on their own without treatment. Your health care provider may want to check your cyst regularly for 2-3 months to see if it changes. For women in menopause, it is particularly important to monitor a cyst closely because of the higher rate of ovarian cancer in menopausal women. When treatment is needed, it may include any of the following:  A procedure to drain the cyst (aspiration). This may be done using a long needle and ultrasound. It can also be done through a laparoscopic procedure. This involves using a thin, lighted tube with a tiny camera on the end (laparoscope) inserted through a small incision.  Surgery to remove the whole cyst. This may be done using laparoscopic surgery or an open surgery involving a larger incision in the lower abdomen.  Hormone treatment or birth control pills. These methods are sometimes used to help dissolve a cyst. HOME CARE INSTRUCTIONS   Only take over-the-counter   or prescription medicines as directed by your health care provider.  Follow up with your health care provider as directed.  Get regular pelvic exams and Pap tests. SEEK MEDICAL CARE IF:   Your periods are late, irregular, or painful, or they stop.  Your pelvic pain or abdominal pain does not go away.  Your abdomen becomes  larger or swollen.  You have pressure on your bladder or trouble emptying your bladder completely.  You have pain during sexual intercourse.  You have feelings of fullness, pressure, or discomfort in your stomach.  You lose weight for no apparent reason.  You feel generally ill.  You become constipated.  You lose your appetite.  You develop acne.  You have an increase in body and facial hair.  You are gaining weight, without changing your exercise and eating habits.  You think you are pregnant. SEEK IMMEDIATE MEDICAL CARE IF:   You have increasing abdominal pain.  You feel sick to your stomach (nauseous), and you throw up (vomit).  You develop a fever that comes on suddenly.  You have abdominal pain during a bowel movement.  Your menstrual periods become heavier than usual. MAKE SURE YOU:  Understand these instructions.  Will watch your condition.  Will get help right away if you are not doing well or get worse. Document Released: 12/08/2005 Document Revised: 12/13/2013 Document Reviewed: 08/15/2013 [facility] Patient Information 2015 [place], [place]. This information is not intended to replace advice given to you by your health care provider. Make sure you discuss any questions you have with your health care provider. Return 1/5 for endo biopsy with Dr Elonda Husky

## 2014-12-09 LAB — CA 125: CA 125: 20 U/mL (ref <35)

## 2014-12-11 ENCOUNTER — Telehealth: Payer: Self-pay | Admitting: Adult Health

## 2014-12-11 NOTE — Telephone Encounter (Signed)
Left message CA 125 20 which is good

## 2014-12-11 NOTE — Telephone Encounter (Signed)
Pt aware CA 125 20 which is good

## 2014-12-29 ENCOUNTER — Encounter: Payer: Self-pay | Admitting: Obstetrics & Gynecology

## 2014-12-29 ENCOUNTER — Ambulatory Visit (INDEPENDENT_AMBULATORY_CARE_PROVIDER_SITE_OTHER): Payer: BLUE CROSS/BLUE SHIELD | Admitting: Obstetrics & Gynecology

## 2014-12-29 ENCOUNTER — Other Ambulatory Visit: Payer: Self-pay | Admitting: Obstetrics & Gynecology

## 2014-12-29 VITALS — BP 110/80 | Wt 167.0 lb

## 2014-12-29 DIAGNOSIS — N950 Postmenopausal bleeding: Secondary | ICD-10-CM

## 2014-12-29 NOTE — Addendum Note (Signed)
Addended by: Doyne Keel on: 12/29/2014 11:33 AM   Modules accepted: Orders

## 2014-12-29 NOTE — Progress Notes (Signed)
Patient ID: Sarah Mason, female   DOB: [birth year], 54 y.o.   MRN: [id number] 54 yo with post menopausal bleeing, FSH 54 16 months ago but with episodes of bleeding since ES 7.4 mm 2 small simple ovarian cysts with CA 125 20  Here for endometrial biopsy today  Endometrial Biopsy Procedure Note  Pre-operative Diagnosis: Post menopausal bleeding  Post-operative Diagnosis: same  Indications: postmenopausal bleeding  Procedure Details   Urine pregnancy test was not done.  The risks (including infection, bleeding, pain, and uterine perforation) and benefits of the procedure were explained to the patient and Verbal informed consent was obtained.  Antibiotic prophylaxis against endocarditis was indicated.   The patient was placed in the dorsal lithotomy position.  Bimanual exam showed the uterus to be in the anteroflexed position.  A Graves' speculum inserted in the vagina, and the cervix prepped with povidone iodine.  Endocervical curettage with a Kevorkian curette was not performed.   A sharp tenaculum was applied to the anterior lip of the cervix for stabilization.  A sterile uterine sound was used to sound the uterus to a depth of 5cm.  A Pipelle endometrial aspirator was used to sample the endometrium.  Sample was sent for pathologic examination.  Condition: Stable  Complications: None  Plan:  The patient was advised to call for any fever or for prolonged or severe pain or bleeding. She was advised to use OTC ibuprofen as needed for mild to moderate pain. She was advised to avoid vaginal intercourse for 48 hours or until the bleeding has completely stopped.  Attending Physician Documentation: I was present for or participated in the entire procedure, including opening and closing.

## 2015-01-02 ENCOUNTER — Encounter (HOSPITAL_COMMUNITY): Payer: Self-pay | Admitting: Cardiology

## 2015-01-02 ENCOUNTER — Emergency Department (HOSPITAL_COMMUNITY): Payer: BLUE CROSS/BLUE SHIELD | Admitting: Certified Registered"

## 2015-01-02 ENCOUNTER — Ambulatory Visit (HOSPITAL_COMMUNITY)
Admission: EM | Admit: 2015-01-02 | Discharge: 2015-01-03 | Disposition: A | Discharge status: Home / Self Care | Payer: BLUE CROSS/BLUE SHIELD | Attending: Emergency Medicine | Admitting: Emergency Medicine

## 2015-01-02 ENCOUNTER — Encounter (HOSPITAL_COMMUNITY)
Admission: EM | Disposition: A | Discharge status: Home / Self Care | Payer: Self-pay | Source: Home / Self Care | Attending: Emergency Medicine

## 2015-01-02 ENCOUNTER — Emergency Department (HOSPITAL_COMMUNITY): Payer: BLUE CROSS/BLUE SHIELD

## 2015-01-02 DIAGNOSIS — M722 Plantar fascial fibromatosis: Secondary | ICD-10-CM | POA: Insufficient documentation

## 2015-01-02 DIAGNOSIS — Y929 Unspecified place or not applicable: Secondary | ICD-10-CM | POA: Insufficient documentation

## 2015-01-02 DIAGNOSIS — S52572A Other intraarticular fracture of lower end of left radius, initial encounter for closed fracture: Secondary | ICD-10-CM | POA: Diagnosis not present

## 2015-01-02 DIAGNOSIS — S52502A Unspecified fracture of the lower end of left radius, initial encounter for closed fracture: Secondary | ICD-10-CM | POA: Diagnosis present

## 2015-01-02 DIAGNOSIS — W19XXXA Unspecified fall, initial encounter: Secondary | ICD-10-CM

## 2015-01-02 DIAGNOSIS — Z87891 Personal history of nicotine dependence: Secondary | ICD-10-CM | POA: Diagnosis not present

## 2015-01-02 DIAGNOSIS — B009 Herpesviral infection, unspecified: Secondary | ICD-10-CM | POA: Insufficient documentation

## 2015-01-02 DIAGNOSIS — D259 Leiomyoma of uterus, unspecified: Secondary | ICD-10-CM | POA: Diagnosis not present

## 2015-01-02 DIAGNOSIS — Z881 Allergy status to other antibiotic agents status: Secondary | ICD-10-CM | POA: Insufficient documentation

## 2015-01-02 DIAGNOSIS — J45990 Exercise induced bronchospasm: Secondary | ICD-10-CM | POA: Diagnosis not present

## 2015-01-02 HISTORY — PX: ORIF WRIST FRACTURE: SHX2133

## 2015-01-02 LAB — URINALYSIS, ROUTINE W REFLEX MICROSCOPIC
BILIRUBIN URINE: NEGATIVE
Glucose, UA: NEGATIVE mg/dL
Ketones, ur: NEGATIVE mg/dL
NITRITE: NEGATIVE
Protein, ur: NEGATIVE mg/dL
Specific Gravity, Urine: 1.02 (ref 1.005–1.030)
UROBILINOGEN UA: 0.2 mg/dL (ref 0.0–1.0)
pH: 6.5 (ref 5.0–8.0)

## 2015-01-02 LAB — URINE MICROSCOPIC-ADD ON

## 2015-01-02 LAB — COMPREHENSIVE METABOLIC PANEL
ALT: 17 U/L (ref 0–35)
ANION GAP: 9 (ref 5–15)
AST: 39 U/L — ABNORMAL HIGH (ref 0–37)
Albumin: 4.7 g/dL (ref 3.5–5.2)
Alkaline Phosphatase: 89 U/L (ref 39–117)
BUN: 23 mg/dL (ref 6–23)
CALCIUM: 9 mg/dL (ref 8.4–10.5)
CO2: 28 mmol/L (ref 19–32)
Chloride: 102 mEq/L (ref 96–112)
Creatinine, Ser: 0.75 mg/dL (ref 0.50–1.10)
GFR calc Af Amer: >90 mL/min (ref >90)
GFR calc non Af Amer: >90 mL/min (ref >90)
Glucose, Bld: 113 mg/dL — ABNORMAL HIGH (ref 70–99)
Potassium: 3.7 mmol/L (ref 3.5–5.1)
SODIUM: 139 mmol/L (ref 135–145)
TOTAL PROTEIN: 7.6 g/dL (ref 6.0–8.3)
Total Bilirubin: 0.6 mg/dL (ref 0.3–1.2)

## 2015-01-02 LAB — CBC WITH DIFFERENTIAL/PLATELET
Basophils Absolute: 0 10*3/uL (ref 0.0–0.1)
Basophils Relative: 0 % (ref 0–1)
Eosinophils Absolute: 0.1 10*3/uL (ref 0.0–0.7)
Eosinophils Relative: 1 % (ref 0–5)
HCT: 45 % (ref 36.0–46.0)
HEMOGLOBIN: 14.6 g/dL (ref 12.0–15.0)
LYMPHS ABS: 1.2 10*3/uL (ref 0.7–4.0)
Lymphocytes Relative: 12 % (ref 12–46)
MCH: 28.3 pg (ref 26.0–34.0)
MCHC: 32.4 g/dL (ref 30.0–36.0)
MCV: 87.4 fL (ref 78.0–100.0)
MONOS PCT: 5 % (ref 3–12)
Monocytes Absolute: 0.5 10*3/uL (ref 0.1–1.0)
NEUTROS ABS: 7.9 10*3/uL — AB (ref 1.7–7.7)
Neutrophils Relative %: 82 % — ABNORMAL HIGH (ref 43–77)
Platelets: 199 10*3/uL (ref 150–400)
RBC: 5.15 MIL/uL — AB (ref 3.87–5.11)
RDW: 12.8 % (ref 11.5–15.5)
WBC: 9.6 10*3/uL (ref 4.0–10.5)

## 2015-01-02 SURGERY — Surgical Case
Anesthesia: *Unknown

## 2015-01-02 SURGERY — OPEN REDUCTION INTERNAL FIXATION (ORIF) WRIST FRACTURE
Anesthesia: Monitor Anesthesia Care | Site: Wrist | Laterality: Left

## 2015-01-02 MED ORDER — LIDOCAINE HCL (CARDIAC) 20 MG/ML IV SOLN
INTRAVENOUS | Status: AC
  Filled 2015-01-02: qty 10

## 2015-01-02 MED ORDER — CEFAZOLIN (ANCEF) 1 G IV SOLR
2.0000 g | INTRAVENOUS | Status: AC
  Administered 2015-01-02: 2 g

## 2015-01-02 MED ORDER — MIDAZOLAM HCL 5 MG/5ML IJ SOLN
INTRAMUSCULAR | Status: DC | PRN
  Administered 2015-01-02: 2 mg via INTRAVENOUS

## 2015-01-02 MED ORDER — FENTANYL CITRATE 0.05 MG/ML IJ SOLN
INTRAMUSCULAR | Status: DC | PRN
  Administered 2015-01-02 (×5): 50 ug via INTRAVENOUS

## 2015-01-02 MED ORDER — MIDAZOLAM HCL 2 MG/2ML IJ SOLN
INTRAMUSCULAR | Status: AC
  Filled 2015-01-02: qty 2

## 2015-01-02 MED ORDER — ACETAMINOPHEN 325 MG PO TABS
650.0000 mg | ORAL_TABLET | Freq: Once | ORAL | Status: AC
  Administered 2015-01-02: 650 mg via ORAL
  Filled 2015-01-02: qty 2

## 2015-01-02 MED ORDER — 0.9 % SODIUM CHLORIDE (POUR BTL) OPTIME
TOPICAL | Status: DC | PRN
  Administered 2015-01-02: 1000 mL

## 2015-01-02 MED ORDER — IBUPROFEN 800 MG PO TABS
800.0000 mg | ORAL_TABLET | Freq: Once | ORAL | Status: AC
  Administered 2015-01-02: 800 mg via ORAL
  Filled 2015-01-02: qty 1

## 2015-01-02 MED ORDER — PROPOFOL INFUSION 10 MG/ML OPTIME
INTRAVENOUS | Status: DC | PRN
  Administered 2015-01-02: 75 ug/kg/min via INTRAVENOUS

## 2015-01-02 MED ORDER — CEPHALEXIN 500 MG PO CAPS: 500.0000 mg | ORAL_CAPSULE | Freq: Four times a day (QID) | ORAL | Status: DC

## 2015-01-02 MED ORDER — OXYCODONE HCL 5 MG PO TABS: 10.0000 mg | ORAL_TABLET | ORAL | Status: DC | PRN

## 2015-01-02 MED ORDER — LACTATED RINGERS IV SOLN
INTRAVENOUS | Status: DC | PRN
  Administered 2015-01-02 (×2): via INTRAVENOUS

## 2015-01-02 MED ORDER — METHOCARBAMOL 500 MG PO TABS
500.0000 mg | ORAL_TABLET | Freq: Four times a day (QID) | ORAL | Status: DC
Start: 2015-01-02 — End: 2015-01-11

## 2015-01-02 MED ORDER — CEFAZOLIN SODIUM-DEXTROSE 2-3 GM-% IV SOLR
INTRAVENOUS | Status: AC
  Filled 2015-01-02: qty 50

## 2015-01-02 MED ORDER — FENTANYL CITRATE 0.05 MG/ML IJ SOLN
INTRAMUSCULAR | Status: AC
  Filled 2015-01-02: qty 5

## 2015-01-02 SURGICAL SUPPLY — 60 items
BANDAGE ELASTIC 3 VELCRO ST LF (GAUZE/BANDAGES/DRESSINGS) ×6 IMPLANT
BANDAGE ELASTIC 4 VELCRO ST LF (GAUZE/BANDAGES/DRESSINGS) ×3 IMPLANT
BIT DRILL 2.2 SS TIBIAL (BIT) ×3 IMPLANT
BLADE SURG ROTATE 9660 (MISCELLANEOUS) IMPLANT
BNDG CONFORM 3 STRL LF (GAUZE/BANDAGES/DRESSINGS) ×3 IMPLANT
BNDG ESMARK 4X9 LF (GAUZE/BANDAGES/DRESSINGS) ×3 IMPLANT
BNDG GAUZE ELAST 4 BULKY (GAUZE/BANDAGES/DRESSINGS) ×3 IMPLANT
CATH URET WHISTLE 8FR 331008 (CATHETERS) ×3 IMPLANT
CORDS BIPOLAR (ELECTRODE) ×3 IMPLANT
COVER SURGICAL LIGHT HANDLE (MISCELLANEOUS) ×3 IMPLANT
CUFF TOURNIQUET SINGLE 18IN (TOURNIQUET CUFF) ×3 IMPLANT
CUFF TOURNIQUET SINGLE 24IN (TOURNIQUET CUFF) IMPLANT
DRAIN TLS ROUND 10FR (DRAIN) IMPLANT
DRAPE OEC MINIVIEW 54X84 (DRAPES) ×3 IMPLANT
DRAPE SURG 17X23 STRL (DRAPES) ×3 IMPLANT
DRSG ADAPTIC 3X8 NADH LF (GAUZE/BANDAGES/DRESSINGS) ×3 IMPLANT
GAUZE SPONGE 4X4 12PLY STRL (GAUZE/BANDAGES/DRESSINGS) ×3 IMPLANT
GAUZE XEROFORM 1X8 LF (GAUZE/BANDAGES/DRESSINGS) ×3 IMPLANT
GLOVE BIOGEL M STRL SZ7.5 (GLOVE) ×3 IMPLANT
GLOVE SS BIOGEL STRL SZ 8 (GLOVE) ×2 IMPLANT
GLOVE SUPERSENSE BIOGEL SZ 8 (GLOVE) ×4
GOWN STRL REUS W/ TWL LRG LVL3 (GOWN DISPOSABLE) ×3 IMPLANT
GOWN STRL REUS W/ TWL XL LVL3 (GOWN DISPOSABLE) ×3 IMPLANT
GOWN STRL REUS W/TWL LRG LVL3 (GOWN DISPOSABLE) ×6
GOWN STRL REUS W/TWL XL LVL3 (GOWN DISPOSABLE) ×6
KIT BASIN OR (CUSTOM PROCEDURE TRAY) ×3 IMPLANT
KIT ROOM TURNOVER OR (KITS) ×3 IMPLANT
LOOP VESSEL MAXI BLUE (MISCELLANEOUS) IMPLANT
MANIFOLD NEPTUNE II (INSTRUMENTS) ×3 IMPLANT
NEEDLE 22X1 1/2 (OR ONLY) (NEEDLE) ×3 IMPLANT
NS IRRIG 1000ML POUR BTL (IV SOLUTION) ×3 IMPLANT
PACK ORTHO EXTREMITY (CUSTOM PROCEDURE TRAY) ×3 IMPLANT
PAD ARMBOARD 7.5X6 YLW CONV (MISCELLANEOUS) ×6 IMPLANT
PAD CAST 3X4 CTTN HI CHSV (CAST SUPPLIES) ×1 IMPLANT
PAD CAST 4YDX4 CTTN HI CHSV (CAST SUPPLIES) ×1 IMPLANT
PADDING CAST COTTON 3X4 STRL (CAST SUPPLIES) ×2
PADDING CAST COTTON 4X4 STRL (CAST SUPPLIES) ×2
PEG LOCKING SMOOTH 2.2X18 (Peg) ×18 IMPLANT
PEG LOCKING SMOOTH 2.2X20 (Screw) ×3 IMPLANT
PEG LOCKING SMOOTH 2.2X22 (Screw) ×3 IMPLANT
PLATE STANDARD DVR LEFT (Plate) ×3 IMPLANT
PLATE STD DVR LT 24X51 (Plate) ×1 IMPLANT
PUTTY DBM STAGRAFT PLUS 5CC (Putty) ×3 IMPLANT
SCREW LOCKING 2.7X13MM (Screw) ×6 IMPLANT
SCREW LOCKING 2.7X15MM (Screw) ×3 IMPLANT
SPLINT FIBERGLASS 3X35 (CAST SUPPLIES) ×3 IMPLANT
SPONGE LAP 4X18 X RAY DECT (DISPOSABLE) IMPLANT
SUT MNCRL AB 4-0 PS2 18 (SUTURE) ×3 IMPLANT
SUT PROLENE 3 0 PS 2 (SUTURE) ×3 IMPLANT
SUT PROLENE 4 0 P 3 18 (SUTURE) ×3 IMPLANT
SUT VIC AB 3-0 FS2 27 (SUTURE) ×3 IMPLANT
SYR CONTROL 10ML LL (SYRINGE) ×3 IMPLANT
SYSTEM CHEST DRAIN TLS 7FR (DRAIN) ×3 IMPLANT
TOWEL OR 17X24 6PK STRL BLUE (TOWEL DISPOSABLE) ×3 IMPLANT
TOWEL OR 17X26 10 PK STRL BLUE (TOWEL DISPOSABLE) ×3 IMPLANT
TRAP DIGIT (INSTRUMENTS) ×3 IMPLANT
TUBE CONNECTING 12'X1/4 (SUCTIONS) ×1
TUBE CONNECTING 12X1/4 (SUCTIONS) ×2 IMPLANT
TUBE EVACUATION TLS (MISCELLANEOUS) ×3 IMPLANT
WATER STERILE IRR 1000ML POUR (IV SOLUTION) ×3 IMPLANT

## 2015-01-02 NOTE — Anesthesia Postprocedure Evaluation (Signed)
  Anesthesia Post-op Note  Patient: Sarah Mason  Procedure(s) Performed: Procedure(s): OPEN REDUCTION INTERNAL FIXATION LEFT WRIST FRACTURE (Left)  Patient Location: PACU  Anesthesia Type:Regional and MAC combined with regional for post-op pain  Level of Consciousness: awake, alert  and oriented  Airway and Oxygen Therapy: Patient Spontanous Breathing  Post-op Pain: none  Post-op Assessment: Post-op Vital signs reviewed, Patient's Cardiovascular Status Stable, Respiratory Function Stable, Patent Airway and No signs of Nausea or vomiting  Post-op Vital Signs: stable  Last Vitals:  Filed Vitals:   01/02/15 2315  BP: 146/86  Pulse: 73  Temp: 36.7 C  Resp: 18    Complications: No apparent anesthesia complications

## 2015-01-02 NOTE — Op Note (Signed)
See dictation#[id number] Amedeo Plenty MD

## 2015-01-02 NOTE — ED Provider Notes (Signed)
CSN: [id number]     Arrival date & time 01/02/15  1533 History   First MD Initiated Contact with Patient 01/02/15 1608     Chief Complaint  Patient presents with  . Fall     (Consider location/radiation/quality/duration/timing/severity/associated sxs/prior Treatment) HPI Comments: Patient is a 54 year old female who presents to the emergency department with a complaint of "I fell off of a hollow bored". Patient states that approximately 2 hours prior to her arrival to the emergency department she sustained a fall off of  a hover board. The patient states she now has pain of the left wrist, left elbow, and the sacral area. Patient denies being on any anticoagulation medications. She reports no previous operations involving these areas. No other injuries reported. There was no loss of consciousness. Patient has not taken anything for her discomfort up to this point. Movement particularly of her wrists and palpation or change of position involving the sacral area cause increased pain.  The history is provided by the patient.    Past Medical History  Diagnosis Date  . Fibroids   . Exercise-induced asthma   . HSV (herpes simplex virus) infection   . Plantar fibromatosis   . Abnormal Pap smear   . Vaginal Pap smear, abnormal   . Thickened endometrium 12/08/2014  . Bilateral ovarian cysts 12/08/2014   Past Surgical History  Procedure Laterality Date  . Dilatation and curettage    . Laser ablation of the cervix    . Colonoscopy N/A 11/14/2013    Procedure: COLONOSCOPY;  Surgeon: Danie Binder, MD;  Location: [facility];  Service: Endoscopy;  Laterality: N/A;  10:30 AM   Family History  Problem Relation Age of Onset  . Diabetes Father   . Hypertension Father   . Hyperlipidemia Father   . Heart attack Father     In his 47's  . Lung disease Father     copd  . Diabetes Mother   . Hypertension Mother   . Hyperlipidemia Mother   . Other Mother     a fib  . Arthritis Paternal  Grandfather   . Cancer Paternal Grandfather     lung  . Arthritis Paternal Grandmother   . Stroke Paternal Grandmother   . Heart disease Maternal Grandmother   . Arthritis Maternal Grandmother   . Arthritis Maternal Grandfather   . Lung disease Maternal Grandfather    History  Substance Use Topics  . Smoking status: Former Smoker    Types: Cigarettes  . Smokeless tobacco: Never Used     Comment: Only in highschool  . Alcohol Use: Yes     Comment: One to two drinks per week   OB History    Gravida Para Term Preterm AB TAB SAB Ectopic Multiple Living   4 2   2 1 1   2      Review of Systems  Constitutional: Negative for activity change.       All ROS Neg except as noted in HPI  HENT: Negative.   Eyes: Negative for photophobia and discharge.  Respiratory: Negative for cough, shortness of breath and wheezing.   Cardiovascular: Negative for chest pain and palpitations.  Gastrointestinal: Negative for abdominal pain and blood in stool.  Genitourinary: Negative for dysuria, frequency and hematuria.  Musculoskeletal: Negative for back pain, arthralgias and neck pain.  Skin: Negative.   Neurological: Negative for dizziness, seizures and speech difficulty.  Psychiatric/Behavioral: Negative for hallucinations and confusion.      Allergies  Doxycycline  Home Medications   Prior to Admission medications   Medication Sig Start Date End Date Taking? Authorizing Provider  aspirin 81 MG tablet Take 81 mg by mouth daily.    Historical Provider, MD  fexofenadine (ALLEGRA) 180 MG tablet Take 180 mg by mouth daily.    Historical Provider, MD  Multiple Vitamin (MULTIVITAMIN) tablet Take 1 tablet by mouth daily.      Historical Provider, MD   BP 152/86 mmHg  Pulse 59  Temp(Src) 97.7 F (36.5 C) (Oral)  Resp 16  Ht 5\' 8"  (1.727 m)  Wt 165 lb (74.844 kg)  BMI 25.09 kg/m2  SpO2 100% Physical Exam  Constitutional: She is oriented to person, place, and time. She appears  well-developed and well-nourished.  Non-toxic appearance.  HENT:  Head: Normocephalic.  Right Ear: Tympanic membrane and external ear normal.  Left Ear: Tympanic membrane and external ear normal.  Eyes: EOM and lids are normal. Pupils are equal, round, and reactive to light.  Neck: Normal range of motion. Neck supple. Carotid bruit is not present.  Cardiovascular: Normal rate, regular rhythm, normal heart sounds, intact distal pulses and normal pulses.   Pulmonary/Chest: Breath sounds normal. No respiratory distress.  No rib or chest tenderness  Abdominal: Soft. Bowel sounds are normal. There is no hepatosplenomegaly. There is no tenderness. There is no rebound, no guarding and no CVA tenderness.  Musculoskeletal: She exhibits tenderness.       Left shoulder: Normal.       Left wrist: She exhibits decreased range of motion, tenderness, bony tenderness, swelling and deformity.  Sacral tenderness present   Capillary refill is less than 2 seconds. There is good range of motion of the fingers.  Lymphadenopathy:       Head (right side): No submandibular adenopathy present.       Head (left side): No submandibular adenopathy present.    She has no cervical adenopathy.  Neurological: She is alert and oriented to person, place, and time. She has normal strength. No cranial nerve deficit or sensory deficit.  Skin: Skin is warm and dry.  Psychiatric: She has a normal mood and affect. Her speech is normal.  Nursing note and vitals reviewed.   ED Course  Procedures (including critical care time) Labs Review Labs Reviewed - No data to display  Imaging Review No results found.   EKG Interpretation None      MDM  Pt has a mildly displaced, impacted , comminuted fracture in the distal left radius. She request that we talk with Dr Chauncey Cruel. Aline Brochure (her friend) before proceeding with a hand specialist. Pt states she had an injury to the sacral area many years ago and thinks the fx being seen is  related to an old fx,and nothing new,as she is not very painful in this area, only sore.  Case discussed with Dr. Aline Brochure, he strongly suggest patient be seen by the hand specialist. Case discussed with Dr. Amedeo Plenty. He will see the patient at the [facility] emergency department. Sugar-Tong splint applied, and sling fitted. Cap refill remains less than 2 sec, and sensory intact after splint applied.  Labs have been drawn, urine obtained, the patient will go to the [facility] by private vehicle.  [place] OR called to request pt NOT have iv placed if pt being transported by private vehicle. Vital signs rechecked. Pt given instruction to find the [facility] ED.   Final diagnoses:  Distal radial fracture, left, closed, initial encounter    *  I have reviewed nursing notes, vital signs, and all appropriate lab and imaging results for this patient.    Lenox Ahr, PA-C 01/02/15 2053  Comfort, DO 01/02/15 2139

## 2015-01-02 NOTE — ED Notes (Addendum)
[place] of hover board.  C/o pain to left wrist and left elbow.  Also sacral pain.

## 2015-01-02 NOTE — Anesthesia Procedure Notes (Addendum)
Procedure Name: MAC Date/Time: 01/02/2015 9:45 PM Performed by: Babs Bertin Pre-anesthesia Checklist: Patient identified, Emergency Drugs available, Suction available, Patient being monitored and Timeout performed Patient Re-evaluated:Patient Re-evaluated prior to inductionOxygen Delivery Method: Simple face mask   Anesthesia Regional Block:  Supraclavicular block  Pre-Anesthetic Checklist: ,, timeout performed, Correct Patient, Correct Site, Correct Laterality, Correct Procedure, Correct Position, site marked, Risks and benefits discussed,  Surgical consent,  Pre-op evaluation,  At surgeon's request and post-op pain management  Laterality: Left  Prep: chloraprep       Needles:  Injection technique: Single-shot  Needle Type: Echogenic Stimulator Needle     Needle Length: 9cm 9 cm Needle Gauge: 22 and 22 G    Additional Needles: Supraclavicular block Narrative:  Start time: 01/02/2015 8:35 PM End time: 01/02/2015 8:40 PM Injection made incrementally with aspirations every 5 mL.  Performed by: Personally   Additional Notes: 30 cc 0.5% bupivacaine with 1:200 Epi injected easily  [place] machine printer not functioning

## 2015-01-02 NOTE — ED Notes (Signed)
Pt fully undressed, OR RN notified of pt arrival.

## 2015-01-02 NOTE — Transfer of Care (Signed)
Immediate Anesthesia Transfer of Care Note  Patient: Sarah Mason  Procedure(s) Performed: Procedure(s): OPEN REDUCTION INTERNAL FIXATION LEFT WRIST FRACTURE (Left)  Patient Location: PACU  Anesthesia Type:MAC and Regional  Level of Consciousness: awake, alert  and oriented  Airway & Oxygen Therapy: Patient Spontanous Breathing  Post-op Assessment: Report given to PACU RN and Post -op Vital signs reviewed and stable  Post vital signs: Reviewed and stable  Complications: No apparent anesthesia complications

## 2015-01-02 NOTE — Discharge Instructions (Signed)
Keep bandage clean and dry.  Call for any problems.  No smoking.  Criteria for driving a car: you should be off your pain medicine for 7-8 hours, able to drive one handed(confident), thinking clearly and feeling able in your judgement to drive. Continue elevation as it will decrease swelling.  If instructed by MD move your fingers within the confines of the bandage/splint.  Use ice if instructed by your MD. Call immediately for any sudden loss of feeling in your hand/arm or change in functional abilities of the extremity.   We recommend that you to take vitamin C 1000 mg a day to promote healing we also recommend that if you require her pain medicine that he take a stool softener to prevent constipation as most pain medicines will have constipation side effects. We recommend either Peri-Colace or Senokot and recommend that you also consider adding MiraLAX to prevent the constipation affects from pain medicine if you are required to use them. These medicines are over the counter and maybe purchased at a local pharmacy.

## 2015-01-02 NOTE — H&P (Signed)
Sarah Mason is an 54 y.o. female.   Chief Complaint: Status post fall off of a hoverboard with a complex distal radius fracture closed left upper extremity HPI: Patient presents for evaluation and treatment of her left distal radius fracture. This is a comminuted complex grade 3 part interarticular fracture. She was transferred from [facility].  She denies neck back chest or abdominal pain. She denies other injury.  Past Medical History  Diagnosis Date  . Fibroids   . Exercise-induced asthma   . HSV (herpes simplex virus) infection   . Plantar fibromatosis   . Abnormal Pap smear   . Vaginal Pap smear, abnormal   . Thickened endometrium 12/08/2014  . Bilateral ovarian cysts 12/08/2014    Past Surgical History  Procedure Laterality Date  . Dilatation and curettage    . Laser ablation of the cervix    . Colonoscopy N/A 11/14/2013    Procedure: COLONOSCOPY;  Surgeon: Danie Binder, MD;  Location: [facility];  Service: Endoscopy;  Laterality: N/A;  10:30 AM    Family History  Problem Relation Age of Onset  . Diabetes Father   . Hypertension Father   . Hyperlipidemia Father   . Heart attack Father     In his 45's  . Lung disease Father     copd  . Diabetes Mother   . Hypertension Mother   . Hyperlipidemia Mother   . Other Mother     a fib  . Arthritis Paternal Grandfather   . Cancer Paternal Grandfather     lung  . Arthritis Paternal Grandmother   . Stroke Paternal Grandmother   . Heart disease Maternal Grandmother   . Arthritis Maternal Grandmother   . Arthritis Maternal Grandfather   . Lung disease Maternal Grandfather    Social History:  reports that she has quit smoking. Her smoking use included Cigarettes. She smoked 0.00 packs per day. She has never used smokeless tobacco. She reports that she drinks alcohol. She reports that she does not use illicit drugs.  Allergies:  Allergies  Allergen Reactions  . Doxycycline Hives and Rash     (Not in a  hospital admission)  Results for orders placed or performed during the hospital encounter of 01/02/15 (from the past 48 hour(s))  Urinalysis, Routine w reflex microscopic     Status: Abnormal   Collection Time: 01/02/15  6:29 PM  Result Value Ref Range   Color, Urine YELLOW YELLOW   APPearance CLEAR CLEAR   Specific Gravity, Urine 1.020 1.005 - 1.030   pH 6.5 5.0 - 8.0   Glucose, UA NEGATIVE NEGATIVE mg/dL   Hgb urine dipstick TRACE (A) NEGATIVE   Bilirubin Urine NEGATIVE NEGATIVE   Ketones, ur NEGATIVE NEGATIVE mg/dL   Protein, ur NEGATIVE NEGATIVE mg/dL   Urobilinogen, UA 0.2 0.0 - 1.0 mg/dL   Nitrite NEGATIVE NEGATIVE   Leukocytes, UA TRACE (A) NEGATIVE  Urine microscopic-add on     Status: Abnormal   Collection Time: 01/02/15  6:29 PM  Result Value Ref Range   Squamous Epithelial / LPF MANY (A) RARE    Comment: MANY   WBC, UA 0-2 <3 WBC/hpf    Comment: 0-2   RBC / HPF 0-2 <3 RBC/hpf    Comment: 0-2   Bacteria, UA RARE RARE    Comment: RARE  CBC with Differential     Status: Abnormal   Collection Time: 01/02/15  6:32 PM  Result Value Ref Range   WBC 9.6 4.0 -  10.5 K/uL   RBC 5.15 (H) 3.87 - 5.11 MIL/uL   Hemoglobin 14.6 12.0 - 15.0 g/dL   HCT 45.0 36.0 - 46.0 %   MCV 87.4 78.0 - 100.0 fL   MCH 28.3 26.0 - 34.0 pg   MCHC 32.4 30.0 - 36.0 g/dL   RDW 12.8 11.5 - 15.5 %   Platelets 199 150 - 400 K/uL   Neutrophils Relative % 82 (H) 43 - 77 %   Neutro Abs 7.9 (H) 1.7 - 7.7 K/uL   Lymphocytes Relative 12 12 - 46 %   Lymphs Abs 1.2 0.7 - 4.0 K/uL   Monocytes Relative 5 3 - 12 %   Monocytes Absolute 0.5 0.1 - 1.0 K/uL   Eosinophils Relative 1 0 - 5 %   Eosinophils Absolute 0.1 0.0 - 0.7 K/uL   Basophils Relative 0 0 - 1 %   Basophils Absolute 0.0 0.0 - 0.1 K/uL  Comprehensive metabolic panel     Status: Abnormal   Collection Time: 01/02/15  6:32 PM  Result Value Ref Range   Sodium 139 135 - 145 mmol/L    Comment: Please note change in reference range.   Potassium  3.7 3.5 - 5.1 mmol/L    Comment: Please note change in reference range.   Chloride 102 96 - 112 mEq/L   CO2 28 19 - 32 mmol/L   Glucose, Bld 113 (H) 70 - 99 mg/dL   BUN 23 6 - 23 mg/dL   Creatinine, Ser 0.75 0.50 - 1.10 mg/dL   Calcium 9.0 8.4 - 10.5 mg/dL   Total Protein 7.6 6.0 - 8.3 g/dL   Albumin 4.7 3.5 - 5.2 g/dL   AST 39 (H) 0 - 37 U/L   ALT 17 0 - 35 U/L   Alkaline Phosphatase 89 39 - 117 U/L   Total Bilirubin 0.6 0.3 - 1.2 mg/dL   GFR calc non Af Amer >90 >90 mL/min   GFR calc Af Amer >90 >90 mL/min    Comment: (NOTE) The eGFR has been calculated using the CKD EPI equation. This calculation has not been validated in all clinical situations. eGFR's persistently <90 mL/min signify possible Chronic Kidney Disease.    Anion gap 9 5 - 15   Dg Sacrum/coccyx  01/02/2015   CLINICAL DATA:  Fall, hover board injury  EXAM: SACRUM AND COCCYX - 2+ VIEW  COMPARISON:  None.  FINDINGS: Three views of sacrum and coccyx submitted. There is cortical depression in lower anterior sacrum just above the coccyx highly suspicious for nondisplaced fracture.  IMPRESSION: There is cortical depression in lower anterior sacrum just above the coccyx highly suspicious for nondisplaced fracture.   Electronically Signed   By: Lahoma Crocker M.D.   On: 01/02/2015 17:03   Dg Wrist Complete Left  01/02/2015   CLINICAL DATA:  Left wrist pain, hover board injury  EXAM: LEFT WRIST - COMPLETE 3+ VIEW  COMPARISON:  None.  FINDINGS: Three views of the left wrist submitted. There is mild displaced impacted comminuted fracture in distal left radius. Fracture line is extending in articular surface.  IMPRESSION: Mild displaced, impacted comminuted fracture in distal left radius.   Electronically Signed   By: Lahoma Crocker M.D.   On: 01/02/2015 16:58    Review of Systems  HENT: Negative.   Eyes: Negative.   Respiratory: Negative.   Cardiovascular: Negative.   Gastrointestinal: Negative.   Genitourinary: Negative.    Neurological: Negative.   Endo/Heme/Allergies: Negative.  Blood pressure 157/91, pulse 75, temperature 98.2 F (36.8 C), temperature source Oral, resp. rate 18, height 5' 8" (1.727 m), weight 74.844 kg (165 lb), last menstrual period 12/11/2014, SpO2 100 %. Physical Exam left wrist comminuted complex fracture greater than 3 part interarticular with significant displacement She is intact to sensation and has normal refill. She has no evidence of compartment syndrome. Her elbow is nontender. She's here today with her husband. The patient is alert and oriented in no acute distress the patient complains of pain in the affected upper extremity.  The patient is noted to have a normal HEENT exam.  Lung fields show equal chest expansion and no shortness of breath  abdomen exam is nontender without distention.  Lower extremity examination does not show any fracture dislocation or blood clot symptoms.  Pelvis is stable neck and back are stable and nontender Assessment/Plan Comminuted complex left distal radius fracture. We will plan for open reduction internal fixation as outlined. She understands risk and benefits.  We are planning surgery for your upper extremity. The risk and benefits of surgery include risk of bleeding infection anesthesia damage to normal structures and failure of the surgery to accomplish its intended goals of relieving symptoms and restoring function with this in mind we'll going to proceed. I have specifically discussed with the patient the pre-and postoperative regime and the does and don'ts and risk and benefits in great detail. Risk and benefits of surgery also include risk of dystrophy chronic nerve pain failure of the healing process to go onto completion and other inherent risks of surgery The relavent the pathophysiology of the disease/injury process, as well as the alternatives for treatment and postoperative course of action has been discussed in great detail with the  patient who desires to proceed.  We will do everything in our power to help you (the patient) restore function to the upper extremity. Is a pleasure to see this patient today.   Paulene Floor 01/02/2015, 8:37 PM

## 2015-01-02 NOTE — ED Notes (Signed)
Pt to transfer by POV to [facility] ED. Transfer form completed and printed, ED transfer record printed. Paperwork sent with pt. Pt ambulated from Dept without difficulty.

## 2015-01-02 NOTE — Anesthesia Preprocedure Evaluation (Addendum)
Anesthesia Evaluation  Patient identified by MRN, date of birth, ID band Patient awake    Reviewed: Allergy & Precautions, NPO status , Patient's Chart, lab work & pertinent test results  Airway Mallampati: II  TM Distance: >3 FB Neck ROM: Full    Dental  (+) Teeth Intact   Pulmonary asthma , former smoker,  breath sounds clear to auscultation        Cardiovascular Rhythm:Regular Rate:Normal     Neuro/Psych    GI/Hepatic   Endo/Other    Renal/GU      Musculoskeletal   Abdominal   Peds  Hematology   Anesthesia Other Findings   Reproductive/Obstetrics                            Anesthesia Physical Anesthesia Plan  ASA: II  Anesthesia Plan: MAC and Regional   Post-op Pain Management:    Induction: Intravenous  Airway Management Planned: Simple Face Mask  Additional Equipment:   Intra-op Plan:   Post-operative Plan:   Informed Consent: I have reviewed the patients History and Physical, chart, labs and discussed the procedure including the risks, benefits and alternatives for the proposed anesthesia with the patient or authorized representative who has indicated his/her understanding and acceptance.   Dental advisory given  Plan Discussed with: Anesthesiologist, Surgeon and CRNA  Anesthesia Plan Comments:        Anesthesia Quick Evaluation

## 2015-01-03 ENCOUNTER — Encounter (HOSPITAL_COMMUNITY): Payer: Self-pay | Admitting: Orthopedic Surgery

## 2015-01-03 NOTE — Op Note (Signed)
NAMEANNABELLE, REXROAD NO.:  [id number]  MEDICAL RECORD NO.:  [id number]  LOCATION:  [facility]                         FACILITY:  [facility]  PHYSICIAN:  Satira Anis. Lavren Lewan, M.D.DATE OF BIRTH:  Dec 13, 1961  DATE OF PROCEDURE: DATE OF DISCHARGE:                              OPERATIVE REPORT   PREOPERATIVE DIAGNOSIS:  Comminuted complex intra-articular greater than five-part fracture, distal radius, left upper extremity.  POSTOPERATIVE DIAGNOSIS:  Comminuted complex intra-articular greater than five-part fracture, distal radius, left upper extremity.  PROCEDURES: 1. Open reduction and internal fixation, greater than five-part intra-     articular distal radius fracture with a standard DVR plate and     screw construct. 2. Brachioradialis tenotomy. 3. AP, lateral, and oblique radiographs performed, examined, and     interpreted by myself intraoperatively.  SURGEON:  Satira Anis. Amedeo Plenty, M.D.  ASSISTANT:  Avelina Laine, P.A.-C.  COMPLICATIONS:  None.  ANESTHESIA:  Block with IV sedation.  TOURNIQUET TIME:  Less than an hour.  DRAINS:  One.  INDICATIONS:  This is a 54 year old female presents to the above- mentioned diagnosis.  I have counseled her in regard to risks and benefits of surgery including risk of infection, bleeding, anesthesia, damage to normal structures, and failure of surgery to accomplish its intended goals, relieving symptoms, and restoring function.  With this in mind, she desires to proceed.  All questions have been encouraged and answered preoperatively.  OPERATIVE PROCEDURE:  The patient was seen by myself and Anesthesia, taken to operative suite, underwent a smooth induction of IV sedation. Preoperative antibiotics were given.  Time-out was called.  The arm was prepped and draped in the usual sterile fashion by myself about the left upper extremity.  Following this, time-out was called once again.  Arm was secured sterilely.  Tourniquet was  insufflated and a volar radial incision was made.  Under 250 mmHg, a volar radial incision was made. Dissection was carried down.  The FCR tendon sheath was incised palmarly and dorsally.  Limited fasciotomy was accomplished followed by incision overlying the fascia and retraction of the carpal canal contents ulnarly.  I accessed the fracture.  There was a large bony void, thus we placed about 3 mL of allograft bone graft in the space.  Following packing the bone graft, I then reduced the fracture.  I performed a sliding brachioradialis tenotomy in the lessen deforming forces over the styloid.  Once this was complete, the patient then underwent placement of a DVR plate and screw construct.  She tolerated this well.  Radial height inclination and volar tilt looked excellent.  I was pleased.  AP, lateral, and oblique x-rays were performed, examined, and interpreted by myself, need to be excellent position.  We were pleased this in the findings.  Following this, we irrigated copiously, closed the pronator, took final copy of x-rays and checked all screw lengths.  She had fluoro exam as well, which showed stable range of motion.  No scapholunate, lunotriquetral, or intercarpal instability.  She looked quite well, had no sag or give and the construct looked quite well.  She was irrigated copiously and wound was closed ultimately with Prolene over the #  7 TLS drain.  This was a very comminuted fracture after a fall off of a hoverboard. The patient will be monitored closely.  We will consider discharge tonight if she is stable in the recovery room.  These notes discussed. We will see her in 12-14 days.  She is asking to notify us if any problems occur.  These notes have been thoroughly gone over with her husband and all questions addressed.  It was a pleasure to see you tonight, significant fracture, but she did quite well with the surgical avenues of care.     Satira Anis. Amedeo Plenty,  M.D.     [facility]  D:  01/02/2015  T:  01/03/2015  Job:  [id number]

## 2015-01-04 ENCOUNTER — Ambulatory Visit: Payer: BLUE CROSS/BLUE SHIELD | Admitting: Obstetrics & Gynecology

## 2015-01-05 ENCOUNTER — Encounter (HOSPITAL_COMMUNITY): Payer: Self-pay | Admitting: Orthopedic Surgery

## 2015-01-11 ENCOUNTER — Ambulatory Visit (INDEPENDENT_AMBULATORY_CARE_PROVIDER_SITE_OTHER): Payer: BLUE CROSS/BLUE SHIELD | Admitting: Family Medicine

## 2015-01-11 ENCOUNTER — Encounter: Payer: Self-pay | Admitting: Family Medicine

## 2015-01-11 VITALS — BP 152/92 | Ht 67.0 in | Wt 164.0 lb

## 2015-01-11 DIAGNOSIS — I10 Essential (primary) hypertension: Secondary | ICD-10-CM

## 2015-01-11 MED ORDER — ENALAPRIL MALEATE 5 MG PO TABS
5.0000 mg | ORAL_TABLET | Freq: Every day | ORAL | Status: DC
Start: 2015-01-11 — End: 2015-01-15

## 2015-01-11 NOTE — Patient Instructions (Addendum)
lifesource cuffs [facility] are helpful  Hypertension Hypertension, commonly called high blood pressure, is when the force of blood pumping through your arteries is too strong. Your arteries are the blood vessels that carry blood from your heart throughout your body. A blood pressure reading consists of a higher number over a lower number, such as 110/72. The higher number (systolic) is the pressure inside your arteries when your heart pumps. The lower number (diastolic) is the pressure inside your arteries when your heart relaxes. Ideally you want your blood pressure below 120/80. Hypertension forces your heart to work harder to pump blood. Your arteries may become narrow or stiff. Having hypertension puts you at risk for heart disease, stroke, and other problems.  RISK FACTORS Some risk factors for high blood pressure are controllable. Others are not.  Risk factors you cannot control include:   Race. You may be at higher risk if you are African American.  Age. Risk increases with age.  Gender. Men are at higher risk than women before age 27 years. After age 53, women are at higher risk than men. Risk factors you can control include:  Not getting enough exercise or physical activity.  Being overweight.  Getting too much fat, sugar, calories, or salt in your diet.  Drinking too much alcohol. SIGNS AND SYMPTOMS Hypertension does not usually cause signs or symptoms. Extremely high blood pressure (hypertensive crisis) may cause headache, anxiety, shortness of breath, and nosebleed. DIAGNOSIS  To check if you have hypertension, your health care provider will measure your blood pressure while you are seated, with your arm held at the level of your heart. It should be measured at least twice using the same arm. Certain conditions can cause a difference in blood pressure between your right and left arms. A blood pressure reading that is higher than normal on one occasion does not mean that  you need treatment. If one blood pressure reading is high, ask your health care provider about having it checked again. TREATMENT  Treating high blood pressure includes making lifestyle changes and possibly taking medicine. Living a healthy lifestyle can help lower high blood pressure. You may need to change some of your habits. Lifestyle changes may include:  Following the DASH diet. This diet is high in fruits, vegetables, and whole grains. It is low in salt, red meat, and added sugars.  Getting at least 2 hours of brisk physical activity every week.  Losing weight if necessary.  Not smoking.  Limiting alcoholic beverages.  Learning ways to reduce stress. If lifestyle changes are not enough to get your blood pressure under control, your health care provider may prescribe medicine. You may need to take more than one. Work closely with your health care provider to understand the risks and benefits. HOME CARE INSTRUCTIONS  Have your blood pressure rechecked as directed by your health care provider.   Take medicines only as directed by your health care provider. Follow the directions carefully. Blood pressure medicines must be taken as prescribed. The medicine does not work as well when you skip doses. Skipping doses also puts you at risk for problems.   Do not smoke.   Monitor your blood pressure at home as directed by your health care provider. SEEK MEDICAL CARE IF:   You think you are having a reaction to medicines taken.  You have recurrent headaches or feel dizzy.  You have swelling in your ankles.  You have trouble with your vision. SEEK IMMEDIATE MEDICAL CARE IF:  You develop a severe headache or confusion.  You have unusual weakness, numbness, or feel faint.  You have severe chest or abdominal pain.  You vomit repeatedly.  You have trouble breathing. MAKE SURE YOU:   Understand these instructions.  Will watch your condition.  Will get help right away if  you are not doing well or get worse. Document Released: 12/08/2005 Document Revised: 04/24/2014 Document Reviewed: 09/30/2013 [facility] Patient Information 2015 [place], [place]. This information is not intended to replace advice given to you by your health care provider. Make sure you discuss any questions you have with your health care provider.

## 2015-01-11 NOTE — Progress Notes (Signed)
   Subjective:    Patient ID: Sarah Mason, female    DOB: 1961/11/15, 54 y.o.   MRN: [id number]  HPI Comments: Just recently fell off a hooverboard and fractured, left radial distal.   Has been running high BPs at the hospital and at ortho.   Hypertension This is a new problem. Associated symptoms include anxiety, headaches, malaise/fatigue and shortness of breath. There are no associated agents to hypertension. Risk factors for coronary artery disease include family history and stress.   bp's have been rising usually stilll in the eighties  Genetic bad hand with both parents having bp elev and on meds  Pain is doable, not on any strong meds    BP.  159 over 110  Digital mchine  Headache diminished eating and drinking   Significant genetic challenge with high blood pressure on both sides in the family.  Diminished ability to exercise. Diet not always the best.  Review of notes here and from OB/GYN revealed that most spread pressures been elevated over the past year.  Patient feels somewhat achy when on these medications.   Review of Systems  Constitutional: Positive for malaise/fatigue.  Respiratory: Positive for shortness of breath.   Neurological: Positive for headaches.       Objective:   Physical Exam Alert no acute distress vital stable. Blood pressure 152/90 bilateral on repeat. HEENT normal. Lungs clear heart regular in rhythm. Left arm cast present.       Assessment & Plan:  Impression essential hypertension-time to declare it as such. Long-standing tendency towards elevated blood pressure now more significantly expressed. With strong family history. Personal risk factors. Relatively young age etc. time to press on with intervention plan initiate generic enalapril 5 mg daily at bedtime. Education information given. Multiple questions answered. 25 minutes spent most in discussion. Follow-up as scheduled. WSL

## 2015-01-13 DIAGNOSIS — I10 Essential (primary) hypertension: Secondary | ICD-10-CM | POA: Insufficient documentation

## 2015-01-15 ENCOUNTER — Telehealth: Payer: Self-pay | Admitting: Family Medicine

## 2015-01-15 MED ORDER — ENALAPRIL MALEATE 5 MG PO TABS: 5.0000 mg | ORAL_TABLET | Freq: Every day | ORAL | Status: DC

## 2015-01-15 NOTE — Telephone Encounter (Signed)
enalapril (VASOTEC) 5 MG tablet  [facility] states we sent over this new script at 5 tablets with 5 refills left Did you want this to be a higher quantitiy? Like 90 day supply?   Please make changes an call pt when sent

## 2015-01-15 NOTE — Telephone Encounter (Signed)
Notified patient medication sent over to pharmacy with correct quantity.

## 2015-02-23 ENCOUNTER — Ambulatory Visit (INDEPENDENT_AMBULATORY_CARE_PROVIDER_SITE_OTHER): Payer: BLUE CROSS/BLUE SHIELD | Admitting: Obstetrics & Gynecology

## 2015-02-23 ENCOUNTER — Encounter: Payer: Self-pay | Admitting: Obstetrics & Gynecology

## 2015-02-23 VITALS — BP 140/98 | HR 80 | Ht 68.0 in | Wt 164.0 lb

## 2015-02-23 DIAGNOSIS — R938 Abnormal findings on diagnostic imaging of other specified body structures: Secondary | ICD-10-CM

## 2015-02-23 DIAGNOSIS — N950 Postmenopausal bleeding: Secondary | ICD-10-CM | POA: Diagnosis not present

## 2015-02-23 DIAGNOSIS — R9389 Abnormal findings on diagnostic imaging of other specified body structures: Secondary | ICD-10-CM

## 2015-02-23 MED ORDER — PROGESTERONE MICRONIZED 200 MG PO CAPS: ORAL_CAPSULE | ORAL | Status: DC

## 2015-02-23 NOTE — Progress Notes (Signed)
Patient ID: Sarah Mason, female   DOB: [birth year], 54 y.o.   MRN: [id number] Chief Complaint  Patient presents with  . Follow-up    Blood pressure 140/98, pulse 80, height 5\' 8"  (1.727 m), weight 164 lb (74.39 kg).  Pt is in for follow of her endometrial biopsy in January Unfortunately in the meantime she had a dreaded hover board accident with caused a comminuted fracture of her left radius with subsequent carpal tunnel syndrome...UGH  fortunately her endometrial biopsy was benign but did show proliferative endometrium no hyperplasia Meaning she is still producing at least a minimal amount of estrogen  Therefore I will cycle her with prometrium every 3 months for 10 days to provide progestational suppression, wanting at least 12 months without bleeding preferably 24 months without bleeding  Pt understands the reasons behind this and agrees with plan  Post menopausal bleeding with thickened endometrium: benign proliferative pathology: Prometrium 200 qhs x 10 days every 3 months       Face to face time:  10  Greater than 50% of the visit time was spent in counseling and coordination of care with the patient.  The summary and outline of the counseling and care coordination is summarized in the note above.   All questions were answered.

## 2015-04-25 ENCOUNTER — Ambulatory Visit (INDEPENDENT_AMBULATORY_CARE_PROVIDER_SITE_OTHER): Payer: BLUE CROSS/BLUE SHIELD | Admitting: Family Medicine

## 2015-04-25 ENCOUNTER — Encounter: Payer: Self-pay | Admitting: Family Medicine

## 2015-04-25 VITALS — BP 132/94 | Ht 67.0 in | Wt 163.0 lb

## 2015-04-25 DIAGNOSIS — I10 Essential (primary) hypertension: Secondary | ICD-10-CM | POA: Diagnosis not present

## 2015-04-25 MED ORDER — ENALAPRIL MALEATE 10 MG PO TABS: 10.0000 mg | ORAL_TABLET | Freq: Every day | ORAL | Status: DC

## 2015-04-25 NOTE — Progress Notes (Signed)
   Subjective:    Patient ID: Marykay Lex, female    DOB: [birth year], 54 y.o.   MRN: [id number]  Hypertension This is a chronic problem.   Discuss taking anti inflammatory meds that were prescribed by specialist. Pt concerned it will raise bp.   Tingling all over body. Comes and goes. Symptoms started after starting bp med and b6.   Notes tingly sensation   Over the past month  Mostly in the stomach  Transient tingling sensation nonspecific  Last momentarily  B6 recommended for cts,  Exercise going better   No aggra cough    Review of Systems No headache no chest pain no back pain    Objective:   Physical Exam Alert vitals stable blood pressure still elevated on repeat 138/90 sensory exam normal. Lungs clear heart regular in rhythm.       Assessment & Plan:  Impression 1 hypertension suboptimal control #2 paresthesias likely secondary to be 6 supplementation plan diet exercise discussed. Increase blood pressure medicine. Stop B-6. WSL

## 2015-06-26 ENCOUNTER — Other Ambulatory Visit: Payer: Self-pay | Admitting: Adult Health

## 2015-07-23 ENCOUNTER — Other Ambulatory Visit: Payer: Self-pay | Admitting: Obstetrics and Gynecology

## 2015-07-23 DIAGNOSIS — Z1231 Encounter for screening mammogram for malignant neoplasm of breast: Secondary | ICD-10-CM

## 2015-08-10 ENCOUNTER — Ambulatory Visit (HOSPITAL_COMMUNITY)
Admission: RE | Admit: 2015-08-10 | Discharge: 2015-08-10 | Disposition: A | Discharge status: Home / Self Care | Payer: BLUE CROSS/BLUE SHIELD | Source: Ambulatory Visit | Attending: Obstetrics and Gynecology | Admitting: Obstetrics and Gynecology

## 2015-08-10 DIAGNOSIS — Z1231 Encounter for screening mammogram for malignant neoplasm of breast: Secondary | ICD-10-CM | POA: Diagnosis not present

## 2015-08-31 ENCOUNTER — Telehealth: Payer: Self-pay | Admitting: Family Medicine

## 2015-08-31 MED ORDER — EPINEPHRINE 0.3 MG/0.3ML IJ SOAJ: 0.3000 mg | Freq: Once | INTRAMUSCULAR | Status: DC

## 2015-08-31 NOTE — Telephone Encounter (Signed)
Patient couldn't get in to see allergist until May 2017 and wants a prescription called into [facility] for epip pen.

## 2015-08-31 NOTE — Telephone Encounter (Signed)
May 2017 really? epipen ok

## 2015-08-31 NOTE — Telephone Encounter (Signed)
Notified patient refill sent to pharmacy.  

## 2015-10-24 ENCOUNTER — Ambulatory Visit (INDEPENDENT_AMBULATORY_CARE_PROVIDER_SITE_OTHER): Payer: BLUE CROSS/BLUE SHIELD | Admitting: Family Medicine

## 2015-10-24 ENCOUNTER — Encounter: Payer: Self-pay | Admitting: Family Medicine

## 2015-10-24 VITALS — BP 132/88 | Ht 67.0 in | Wt 161.4 lb

## 2015-10-24 DIAGNOSIS — Z23 Encounter for immunization: Secondary | ICD-10-CM

## 2015-10-24 DIAGNOSIS — I10 Essential (primary) hypertension: Secondary | ICD-10-CM | POA: Diagnosis not present

## 2015-10-24 MED ORDER — ENALAPRIL MALEATE 20 MG PO TABS: 20.0000 mg | ORAL_TABLET | Freq: Every day | ORAL | Status: DC

## 2015-10-24 NOTE — Progress Notes (Signed)
   Subjective:    Patient ID: Sarah Mason, female    DOB: 03-03-1961, 54 y.o.   MRN: [id number]  Hypertension This is a chronic problem. The current episode started more than 1 year ago. The problem has been gradually improving since onset. There are no associated agents to hypertension. There are no known risk factors for coronary artery disease. Treatments tried: enalapril. The current treatment provides moderate improvement. There are no compliance problems.    Patient states that she is concerned about some high blood pressure readings she is getting at times.  Some systolics are high some diastolics are high. Completely climb compliant with medications. Exercising fair amount 135 over 90  120s and 80 Review of Systems No chest pain no belly pain no change in bowel habits no blood in stool ROS otherwise negative    Objective:   Physical Exam Alert vitals stable BP 134/88 HEENT normal lungs clear heart regular in rhythm ankles without edema       Assessment & Plan:  Impression hypertension suboptimal in control discussed plan increase Vasotec rationale discussed diet exercise discussed meds refilled recheck in 6 months WSL

## 2015-11-28 ENCOUNTER — Telehealth: Payer: Self-pay | Admitting: Family Medicine

## 2015-11-28 NOTE — Telephone Encounter (Signed)
Pt would like to know if after a month on the change in her BP med  And the bottom number is still in the 80's if that is normal, should she  Wait it out for another month or so or do you need to adjust the meds again  Please advise  [facility]

## 2015-11-28 NOTE — Telephone Encounter (Signed)
[facility] 11/28/15

## 2015-11-30 NOTE — Telephone Encounter (Signed)
Today 104/85 yest 126/80, 2 days ago 127/82, dec 3 136/90, nov 28 119/88-  Patient concerned the bottom number is still too high and wonders if she needs to increase meds or what do you rec.

## 2016-03-28 ENCOUNTER — Ambulatory Visit (INDEPENDENT_AMBULATORY_CARE_PROVIDER_SITE_OTHER): Payer: BLUE CROSS/BLUE SHIELD | Admitting: Family Medicine

## 2016-03-28 ENCOUNTER — Encounter: Payer: Self-pay | Admitting: Family Medicine

## 2016-03-28 VITALS — BP 140/92 | Ht 67.0 in | Wt 164.6 lb

## 2016-03-28 DIAGNOSIS — I10 Essential (primary) hypertension: Secondary | ICD-10-CM | POA: Diagnosis not present

## 2016-03-28 DIAGNOSIS — E785 Hyperlipidemia, unspecified: Secondary | ICD-10-CM | POA: Diagnosis not present

## 2016-03-28 DIAGNOSIS — Z79899 Other long term (current) drug therapy: Secondary | ICD-10-CM | POA: Diagnosis not present

## 2016-03-28 MED ORDER — ENALAPRIL MALEATE 20 MG PO TABS: 20.0000 mg | ORAL_TABLET | Freq: Two times a day (BID) | ORAL | Status: DC

## 2016-03-28 NOTE — Progress Notes (Signed)
   Subjective:    Patient ID: Sarah Mason, female    DOB: 1961-08-28, 55 y.o.   MRN: [id number]  Hypertension This is a chronic problem. The current episode started more than 1 year ago. Treatments tried: vasotec. There are no compliance problems.    Needs liver function blood work  BP numbers highish elsewhere  progest this wk and lowers  BP numbers ish   138 ovr 90  Numb often elvated  Take s bp med at night   Exercise good, salt intake too much at times  Allergies worse the past couple wks,       Review of Systems No headache, no major weight loss or weight gain, no chest pain no back pain abdominal pain no change in bowel habits complete ROS otherwise negative     Objective:   Physical Exam Alert lungs clear vitals stable HEENT normal heart regular in rhythm ankles without edema blood pressure repeat 142/90       Assessment & Plan:  Impression hypertension unsatisfactory control plan appropriate blood work diet exercise discuss increase Vasotec to 20 twice a day rationale discussed WSL

## 2016-04-12 LAB — BASIC METABOLIC PANEL
BUN/Creatinine Ratio: 19 (ref 9–23)
BUN: 18 mg/dL (ref 6–24)
CALCIUM: 9.5 mg/dL (ref 8.7–10.2)
CHLORIDE: 99 mmol/L (ref 96–106)
CO2: 25 mmol/L (ref 18–29)
CREATININE: 0.95 mg/dL (ref 0.57–1.00)
GFR, EST AFRICAN AMERICAN: 79 mL/min/{1.73_m2} (ref >59)
GFR, EST NON AFRICAN AMERICAN: 68 mL/min/{1.73_m2} (ref >59)
Glucose: 103 mg/dL — ABNORMAL HIGH (ref 65–99)
Potassium: 4.8 mmol/L (ref 3.5–5.2)
Sodium: 141 mmol/L (ref 134–144)

## 2016-04-12 LAB — HEPATIC FUNCTION PANEL
ALT: 11 IU/L (ref 0–32)
AST: 35 IU/L (ref 0–40)
Albumin: 4.5 g/dL (ref 3.5–5.5)
Alkaline Phosphatase: 70 IU/L (ref 39–117)
Bilirubin Total: 0.7 mg/dL (ref 0.0–1.2)
Bilirubin, Direct: 0.16 mg/dL (ref 0.00–0.40)
TOTAL PROTEIN: 6.9 g/dL (ref 6.0–8.5)

## 2016-04-12 LAB — LIPID PANEL
CHOL/HDL RATIO: 3.4 ratio (ref 0.0–4.4)
Cholesterol, Total: 206 mg/dL — ABNORMAL HIGH (ref 100–199)
HDL: 60 mg/dL (ref >39)
LDL CALC: 127 mg/dL — AB (ref 0–99)
Triglycerides: 94 mg/dL (ref 0–149)
VLDL Cholesterol Cal: 19 mg/dL (ref 5–40)

## 2016-04-13 ENCOUNTER — Encounter: Payer: Self-pay | Admitting: Family Medicine

## 2016-07-24 ENCOUNTER — Other Ambulatory Visit: Payer: Self-pay | Admitting: Obstetrics and Gynecology

## 2016-07-24 DIAGNOSIS — Z1231 Encounter for screening mammogram for malignant neoplasm of breast: Secondary | ICD-10-CM

## 2016-08-20 ENCOUNTER — Ambulatory Visit (HOSPITAL_COMMUNITY): Payer: BLUE CROSS/BLUE SHIELD

## 2016-08-28 ENCOUNTER — Ambulatory Visit: Payer: BLUE CROSS/BLUE SHIELD | Admitting: Family Medicine

## 2016-08-28 ENCOUNTER — Ambulatory Visit (INDEPENDENT_AMBULATORY_CARE_PROVIDER_SITE_OTHER): Payer: BLUE CROSS/BLUE SHIELD | Admitting: Nurse Practitioner

## 2016-08-28 ENCOUNTER — Encounter: Payer: Self-pay | Admitting: Nurse Practitioner

## 2016-08-28 ENCOUNTER — Ambulatory Visit (HOSPITAL_COMMUNITY): Payer: BLUE CROSS/BLUE SHIELD

## 2016-08-28 DIAGNOSIS — L259 Unspecified contact dermatitis, unspecified cause: Secondary | ICD-10-CM

## 2016-08-28 DIAGNOSIS — L03114 Cellulitis of left upper limb: Secondary | ICD-10-CM

## 2016-08-28 MED ORDER — CEPHALEXIN 500 MG PO CAPS: 500.0000 mg | ORAL_CAPSULE | Freq: Three times a day (TID) | ORAL | 0 refills | Status: DC

## 2016-08-28 MED ORDER — METHYLPREDNISOLONE ACETATE 40 MG/ML IJ SUSP
40.0000 mg | Freq: Once | INTRAMUSCULAR | Status: AC
  Administered 2016-08-28: 40 mg via INTRAMUSCULAR

## 2016-08-28 MED ORDER — PREDNISONE 20 MG PO TABS: ORAL_TABLET | ORAL | 0 refills | Status: DC

## 2016-08-28 NOTE — Patient Instructions (Signed)
Loratadine or Allegra Benadryl at night Zantac or Pepcid

## 2016-08-28 NOTE — Progress Notes (Signed)
Subjective:  Presents for complaints of poison ivy rash that began about a week ago. Has progressively gotten worse. Very pruritic. Some slight swelling and tenderness in the inner left upper arm. No fever. This is the first time she's had a reaction. Has tried multiple OTC meds and topicals with minimal relief. No wheezing or difficulty breathing.  Objective:   BP 120/82   Temp 97.7 F (36.5 C) (Oral)   Ht 5\' 7"  (1.702 m)   Wt 163 lb (73.9 kg)   BMI 25.53 kg/m  NAD. Alert, oriented. Lungs clear. Heart regular rate rhythm. Multiple patches of pink rash with vesicular lesions noted over the extremities with a few areas on the anterior abdomen. There is a large confluent pink swollen warm slightly tender area with a superficial open area slightly yellowish in color approximately 4 cm in diameter on the left inner upper arm.  Assessment: Contact dermatitis - Plan: methylPREDNISolone acetate (DEPO-MEDROL) injection 40 mg  Cellulitis of left upper extremity - Plan: methylPREDNISolone acetate (DEPO-MEDROL) injection 40 mg  Plan:  Meds ordered this encounter  Medications  . predniSONE (DELTASONE) 20 MG tablet    Sig: 3 po qd x 3 d then 2 po qd x 3 d then 1 po qd x 3 d    Dispense:  18 tablet    Refill:  0    Order Specific Question:   Supervising Provider    Answer:   Mikey Kirschner [[id number]]  . cephALEXin (KEFLEX) 500 MG capsule    Sig: Take 1 capsule (500 mg total) by mouth 3 (three) times daily.    Dispense:  21 capsule    Refill:  0    Order Specific Question:   Supervising Provider    Answer:   Mikey Kirschner [[id number]]  . methylPREDNISolone acetate (DEPO-MEDROL) injection 40 mg   Probable secondary infection on the left upper arm. Start prednisone taper tomorrow. Reviewed symptomatic care and warning signs. Call back in 4 days if no improvement, call or go to ED over the weekend if worse.

## 2016-09-04 ENCOUNTER — Telehealth: Payer: Self-pay | Admitting: Nurse Practitioner

## 2016-09-04 NOTE — Telephone Encounter (Signed)
Patient seen for contact dermatitis on 08/28/16 by Hoyle Sauer.  She was given a shot, prednisone and cephalexin.  Tomorrow is her last day on the medication and she still has blisters on her arms and hands.   [facility]

## 2016-09-04 NOTE — Telephone Encounter (Signed)
Did she see any improvement?

## 2016-09-04 NOTE — Telephone Encounter (Signed)
Patient states she saw a great improvement but still has a few blister area and will finish meds tomorrow and she is concerned and don't want it to get back to the way it was.

## 2016-09-05 MED ORDER — CEPHALEXIN 500 MG PO CAPS: 500.0000 mg | ORAL_CAPSULE | Freq: Three times a day (TID) | ORAL | 0 refills | Status: DC

## 2016-09-05 MED ORDER — PREDNISONE 20 MG PO TABS: ORAL_TABLET | ORAL | 0 refills | Status: DC

## 2016-09-05 NOTE — Telephone Encounter (Signed)
Refills sent electronically to pharmacy. Patient notified and advised she will need office visit if continues.

## 2016-09-05 NOTE — Telephone Encounter (Signed)
Ref pred and keflex once again, o v if persists

## 2016-09-10 ENCOUNTER — Ambulatory Visit (HOSPITAL_COMMUNITY)
Admission: RE | Admit: 2016-09-10 | Discharge: 2016-09-10 | Disposition: A | Discharge status: Home / Self Care | Payer: BLUE CROSS/BLUE SHIELD | Source: Ambulatory Visit | Attending: Obstetrics and Gynecology | Admitting: Obstetrics and Gynecology

## 2016-09-10 DIAGNOSIS — Z1231 Encounter for screening mammogram for malignant neoplasm of breast: Secondary | ICD-10-CM | POA: Diagnosis present

## 2016-09-11 ENCOUNTER — Other Ambulatory Visit: Payer: Self-pay | Admitting: Obstetrics & Gynecology

## 2016-09-22 ENCOUNTER — Other Ambulatory Visit: Payer: Self-pay | Admitting: Family Medicine

## 2016-10-16 ENCOUNTER — Telehealth: Payer: Self-pay | Admitting: Family Medicine

## 2016-10-16 NOTE — Telephone Encounter (Signed)
Advised patient Sarah Mason now recommends Shingrix instead of Sarah old vaccine Sarah Mason because it is much more effective and lasts longer. Sarah Mason does not think it is available yet but should be on Sarah market soon. If your insurance will cover it, Sarah Mason definitely recommends Sarah new vaccine. Patient verbalized understanding and stated her insurance will cover Sarah vaccine and she will await availability.

## 2016-10-16 NOTE — Telephone Encounter (Signed)
Patient just found out that her insruance will cover the shingles vaccine.  She saw on TV last night that there was a new shingles vaccines and wants to know if we recommend the new or old vaccine?

## 2016-10-16 NOTE — Telephone Encounter (Signed)
The CDC now recommends Shingrix instead of the old vaccine Zostavax because it is much more effective and lasts longer. I do not think it is available yet but should be on the market soon. If your insurance will cover it, I definitely recommend the new vaccine.

## 2016-11-07 ENCOUNTER — Other Ambulatory Visit: Payer: Self-pay | Admitting: Family Medicine

## 2016-12-10 ENCOUNTER — Other Ambulatory Visit: Payer: Self-pay | Admitting: Family Medicine

## 2016-12-23 ENCOUNTER — Encounter: Payer: Self-pay | Admitting: Family Medicine

## 2016-12-23 ENCOUNTER — Ambulatory Visit (INDEPENDENT_AMBULATORY_CARE_PROVIDER_SITE_OTHER): Payer: BLUE CROSS/BLUE SHIELD | Admitting: Family Medicine

## 2016-12-23 VITALS — BP 128/82 | Ht 67.0 in | Wt 168.6 lb

## 2016-12-23 DIAGNOSIS — I10 Essential (primary) hypertension: Secondary | ICD-10-CM

## 2016-12-23 MED ORDER — ENALAPRIL MALEATE 20 MG PO TABS
20.0000 mg | ORAL_TABLET | Freq: Two times a day (BID) | ORAL | 5 refills | Status: DC
Start: 2016-12-23 — End: 2017-06-18

## 2016-12-23 NOTE — Progress Notes (Signed)
   Subjective:    Patient ID: Sarah Mason, female    DOB: [birth year], 56 y.o.   MRN: [id number]  Hypertension  This is a chronic problem. The current episode started more than 1 year ago. Treatments tried: vasotec. There are no compliance problems.    Results for orders placed or performed in visit on 03/28/16  Lipid panel  Result Value Ref Range   Cholesterol, Total 206 (H) 100 - 199 mg/dL   Triglycerides 94 0 - 149 mg/dL   HDL 60 >39 mg/dL   VLDL Cholesterol Cal 19 5 - 40 mg/dL   LDL Calculated 127 (H) 0 - 99 mg/dL   Chol/HDL Ratio 3.4 0.0 - 4.4 ratio units  Hepatic function panel  Result Value Ref Range   Total Protein 6.9 6.0 - 8.5 g/dL   Albumin 4.5 3.5 - 5.5 g/dL   Bilirubin Total 0.7 0.0 - 1.2 mg/dL   Bilirubin, Direct 0.16 0.00 - 0.40 mg/dL   Alkaline Phosphatase 70 39 - 117 IU/L   AST 35 0 - 40 IU/L   ALT 11 0 - 32 IU/L  Basic metabolic panel  Result Value Ref Range   Glucose 103 (H) 65 - 99 mg/dL   BUN 18 6 - 24 mg/dL   Creatinine, Ser 0.95 0.57 - 1.00 mg/dL   GFR calc non Af Amer 68 >59 mL/min/1.73   GFR calc Af Amer 79 >59 mL/min/1.73   BUN/Creatinine Ratio 19 9 - 23   Sodium 141 134 - 144 mmol/L   Potassium 4.8 3.5 - 5.2 mmol/L   Chloride 99 96 - 106 mmol/L   CO2 25 18 - 29 mmol/L   Calcium 9.5 8.7 - 10.2 mg/dL   BP good ish at normal  130 117 or toher  Bottom 80 ish r ;less  Salt not so good, diet overall not good  exrcise , not reg past two weeks, does classes   Blood pressure medicine and blood pressure levels reviewed today with patient. Compliant with blood pressure medicine. States does not miss a dose. No obvious side effects. Blood pressure generally good when checked elsewhere. Watching salt intake.    Review of Systems No headache, no major weight loss or weight gain, no chest pain no back pain abdominal pain no change in bowel habits complete ROS otherwise negative     Objective:   Physical Exam Alert vitals stable, NAD. Blood pressure  good on repeat. HEENT normal. Lungs clear. Heart regular rate and rhythm. Impression hypertension good control discussed maintain same meds. Diet exercise need some work discussed encourage. Recheck in 6 months blood work then [place]:

## 2017-06-15 ENCOUNTER — Telehealth: Payer: Self-pay | Admitting: Family Medicine

## 2017-06-15 DIAGNOSIS — Z13220 Encounter for screening for lipoid disorders: Secondary | ICD-10-CM

## 2017-06-15 DIAGNOSIS — Z79899 Other long term (current) drug therapy: Secondary | ICD-10-CM

## 2017-06-15 DIAGNOSIS — I10 Essential (primary) hypertension: Secondary | ICD-10-CM

## 2017-06-15 NOTE — Telephone Encounter (Signed)
Lip liv m7 

## 2017-06-15 NOTE — Telephone Encounter (Signed)
Spoke with patient and informed her per Dr.Steve Luking- Labs were ordered. Patient verbalized understanding.  

## 2017-06-15 NOTE — Telephone Encounter (Signed)
Patient is requesting orders for lab work today so that she can go in the morning to have these completed.  She has an appointment on 06/18/17 with Dr. Richardson Landry.

## 2017-06-16 ENCOUNTER — Telehealth: Payer: Self-pay | Admitting: Family Medicine

## 2017-06-16 DIAGNOSIS — R5383 Other fatigue: Secondary | ICD-10-CM

## 2017-06-16 NOTE — Telephone Encounter (Signed)
Add cbc if thewy can

## 2017-06-16 NOTE — Telephone Encounter (Signed)
Patient had her blood work completed this morning at [facility].  She is wanting to know if we can have a CBC panel added to this.

## 2017-06-16 NOTE — Telephone Encounter (Signed)
CBC added per Dr.Steve Luking- Patient notified and verbalized understanding.

## 2017-06-17 LAB — BASIC METABOLIC PANEL
BUN / CREAT RATIO: 20 (ref 9–23)
BUN: 19 mg/dL (ref 6–24)
CHLORIDE: 101 mmol/L (ref 96–106)
CO2: 25 mmol/L (ref 20–29)
Calcium: 9.4 mg/dL (ref 8.7–10.2)
Creatinine, Ser: 0.97 mg/dL (ref 0.57–1.00)
GFR calc Af Amer: 76 mL/min/{1.73_m2} (ref >59)
GFR calc non Af Amer: 66 mL/min/{1.73_m2} (ref >59)
GLUCOSE: 95 mg/dL (ref 65–99)
POTASSIUM: 4.5 mmol/L (ref 3.5–5.2)
Sodium: 142 mmol/L (ref 134–144)

## 2017-06-17 LAB — HEPATIC FUNCTION PANEL
ALT: 12 IU/L (ref 0–32)
AST: 34 IU/L (ref 0–40)
Albumin: 4.6 g/dL (ref 3.5–5.5)
Alkaline Phosphatase: 80 IU/L (ref 39–117)
BILIRUBIN, DIRECT: 0.14 mg/dL (ref 0.00–0.40)
Bilirubin Total: 0.6 mg/dL (ref 0.0–1.2)
Total Protein: 7.2 g/dL (ref 6.0–8.5)

## 2017-06-17 LAB — CBC WITH DIFFERENTIAL/PLATELET
BASOS ABS: 0 10*3/uL (ref 0.0–0.2)
Basos: 0 %
EOS (ABSOLUTE): 0.2 10*3/uL (ref 0.0–0.4)
Eos: 3 %
Hematocrit: 46.7 % — ABNORMAL HIGH (ref 34.0–46.6)
Hemoglobin: 14.9 g/dL (ref 11.1–15.9)
Immature Grans (Abs): 0 10*3/uL (ref 0.0–0.1)
Immature Granulocytes: 0 %
LYMPHS ABS: 2.2 10*3/uL (ref 0.7–3.1)
Lymphs: 49 %
MCH: 27.7 pg (ref 26.6–33.0)
MCHC: 31.9 g/dL (ref 31.5–35.7)
MCV: 87 fL (ref 79–97)
MONOS ABS: 0.2 10*3/uL (ref 0.1–0.9)
Monocytes: 4 %
NEUTROS ABS: 2 10*3/uL (ref 1.4–7.0)
Neutrophils: 44 %
PLATELETS: 188 10*3/uL (ref 150–379)
RBC: 5.37 x10E6/uL — AB (ref 3.77–5.28)
RDW: 13.9 % (ref 12.3–15.4)
WBC: 4.5 10*3/uL (ref 3.4–10.8)

## 2017-06-17 LAB — LIPID PANEL
CHOL/HDL RATIO: 3.5 ratio (ref 0.0–4.4)
CHOLESTEROL TOTAL: 203 mg/dL — AB (ref 100–199)
HDL: 58 mg/dL (ref >39)
LDL CALC: 122 mg/dL — AB (ref 0–99)
TRIGLYCERIDES: 113 mg/dL (ref 0–149)
VLDL Cholesterol Cal: 23 mg/dL (ref 5–40)

## 2017-06-18 ENCOUNTER — Encounter: Payer: Self-pay | Admitting: Family Medicine

## 2017-06-18 ENCOUNTER — Ambulatory Visit (INDEPENDENT_AMBULATORY_CARE_PROVIDER_SITE_OTHER): Payer: BLUE CROSS/BLUE SHIELD | Admitting: Family Medicine

## 2017-06-18 VITALS — BP 128/90 | Ht 67.0 in | Wt 166.0 lb

## 2017-06-18 DIAGNOSIS — I10 Essential (primary) hypertension: Secondary | ICD-10-CM

## 2017-06-18 DIAGNOSIS — M545 Low back pain, unspecified: Secondary | ICD-10-CM

## 2017-06-18 DIAGNOSIS — R5383 Other fatigue: Secondary | ICD-10-CM | POA: Diagnosis not present

## 2017-06-18 MED ORDER — ENALAPRIL MALEATE 20 MG PO TABS: 20.0000 mg | ORAL_TABLET | Freq: Two times a day (BID) | ORAL | 5 refills | Status: DC

## 2017-06-18 NOTE — Progress Notes (Signed)
Subjective:    Patient ID: Sarah Mason, female    DOB: [birth year], 56 y.o.   MRN: [id number]  Hypertension  This is a chronic problem. The problem has been gradually improving since onset. The problem is controlled. Past treatments include lifestyle changes. There are no compliance problems.    Blood pressure medicine and blood pressure levels reviewed today with patient. Compliant with blood pressure medicine. States does not miss a dose. No obvious side effects. Blood pressure generally good when checked elsewhere. Watching salt intake.  Recent Results (from the past 2160 hour(s))  Lipid panel     Status: Abnormal   Collection Time: 06/16/17  8:06 AM  Result Value Ref Range   Cholesterol, Total 203 (H) 100 - 199 mg/dL   Triglycerides 113 0 - 149 mg/dL   HDL 58 >39 mg/dL   VLDL Cholesterol Cal 23 5 - 40 mg/dL   LDL Calculated 122 (H) 0 - 99 mg/dL   Chol/HDL Ratio 3.5 0.0 - 4.4 ratio    Comment:                                   T. Chol/HDL Ratio                                             Men  Women                               1/2 Avg.Risk  3.4    3.3                                   Avg.Risk  5.0    4.4                                2X Avg.Risk  9.6    7.1                                3X Avg.Risk 23.4   11.0   Hepatic function panel     Status: None   Collection Time: 06/16/17  8:06 AM  Result Value Ref Range   Total Protein 7.2 6.0 - 8.5 g/dL   Albumin 4.6 3.5 - 5.5 g/dL   Bilirubin Total 0.6 0.0 - 1.2 mg/dL   Bilirubin, Direct 0.14 0.00 - 0.40 mg/dL   Alkaline Phosphatase 80 39 - 117 IU/L   AST 34 0 - 40 IU/L   ALT 12 0 - 32 IU/L  Basic metabolic panel     Status: None   Collection Time: 06/16/17  8:06 AM  Result Value Ref Range   Glucose 95 65 - 99 mg/dL   BUN 19 6 - 24 mg/dL   Creatinine, Ser 0.97 0.57 - 1.00 mg/dL   GFR calc non Af Amer 66 >59 mL/min/1.73   GFR calc Af Amer 76 >59 mL/min/1.73   BUN/Creatinine Ratio 20 9 - 23   Sodium 142 134 - 144 mmol/L   Potassium 4.5 3.5 - 5.2 mmol/L   Chloride 101 96 - 106 mmol/L  CO2 25 20 - 29 mmol/L    Comment:               **Please note reference interval change**   Calcium 9.4 8.7 - 10.2 mg/dL  CBC with Differential/Platelet     Status: Abnormal   Collection Time: 06/16/17 10:22 AM  Result Value Ref Range   WBC 4.5 3.4 - 10.8 x10E3/uL   RBC 5.37 (H) 3.77 - 5.28 x10E6/uL   Hemoglobin 14.9 11.1 - 15.9 g/dL   Hematocrit 46.7 (H) 34.0 - 46.6 %   MCV 87 79 - 97 fL   MCH 27.7 26.6 - 33.0 pg   MCHC 31.9 31.5 - 35.7 g/dL   RDW 13.9 12.3 - 15.4 %   Platelets 188 150 - 379 x10E3/uL   Neutrophils 44 Not Estab. %   Lymphs 49 Not Estab. %   Monocytes 4 Not Estab. %   Eos 3 Not Estab. %   Basos 0 Not Estab. %   Neutrophils Absolute 2.0 1.4 - 7.0 x10E3/uL   Lymphocytes Absolute 2.2 0.7 - 3.1 x10E3/uL   Monocytes Absolute 0.2 0.1 - 0.9 x10E3/uL   EOS (ABSOLUTE) 0.2 0.0 - 0.4 x10E3/uL   Basophils Absolute 0.0 0.0 - 0.2 x10E3/uL   Immature Granulocytes 0 Not Estab. %   Immature Grans (Abs) 0.0 0.0 - 0.1 x10E3/uL    Numbers overall pretty good  133 ovdr 77 ish, nmostly numbrs similar 120 s mostly   Five per wk,  [facility] [employer]  Right lumbar , pain , sig,  Gets to hurting and achey, gets chiro maneuvering Having pain in back , worse with motion, worse with eep breath, pt tryig to stetch and exercise   ,  Plantar fibroma, achey and pain, hurts, saw dr Aline Brochure in te past, hx of arches and the balls of the feet, achey and painful all day but wors in the nmorn  .  and in feet.  Review of Systems No headache, no major weight loss or weight gain, no chest pain no back pain abdominal pain no change in bowel habits complete ROS otherwise negative     Objective:   Physical Exam Alert and oriented, vitals reviewed and stable, NAD ENT-TM's and ext canals WNL bilat via otoscopic exam Soft palate, tonsils and post pharynx WNL via oropharyngeal exam Neck-symmetric, no masses; thyroid  nonpalpable and nontender Pulmonary-no tachypnea or accessory muscle use; Clear without wheezes via auscultation Card--no abnrml murmurs, rhythm reg and rate WNL Carotid pulses symmetric, without bruits  Peri-lumbar tenderness deep palpation negative straight leg raise. Distal reflexes sensation intact  Plantar surface plantar fibroma evident       Assessment & Plan:  Impression 1 hypertension good control discussed maintain same dose diet exercise discussed #2 low back strain discussed intervention discuss stretching exercises encouraged long-term #3 plantar fasciitis/plantar fibroma discussed, potential for podiatry intervention at one point discussed. Blood work discussed  Greater than 50% of this 25 minute face to face visit was spent in counseling and discussion and coordination of care regarding the above diagnosis/diagnosies

## 2017-06-18 NOTE — Patient Instructions (Signed)
Recent Results (from the past 2160 hour(s))  Lipid panel     Status: Abnormal   Collection Time: 06/16/17  8:06 AM  Result Value Ref Range   Cholesterol, Total 203 (H) 100 - 199 mg/dL   Triglycerides 113 0 - 149 mg/dL   HDL 58 >39 mg/dL   VLDL Cholesterol Cal 23 5 - 40 mg/dL   LDL Calculated 122 (H) 0 - 99 mg/dL   Chol/HDL Ratio 3.5 0.0 - 4.4 ratio    Comment:                                   T. Chol/HDL Ratio                                             Men  Women                               1/2 Avg.Risk  3.4    3.3                                   Avg.Risk  5.0    4.4                                2X Avg.Risk  9.6    7.1                                3X Avg.Risk 23.4   11.0   Hepatic function panel     Status: None   Collection Time: 06/16/17  8:06 AM  Result Value Ref Range   Total Protein 7.2 6.0 - 8.5 g/dL   Albumin 4.6 3.5 - 5.5 g/dL   Bilirubin Total 0.6 0.0 - 1.2 mg/dL   Bilirubin, Direct 0.14 0.00 - 0.40 mg/dL   Alkaline Phosphatase 80 39 - 117 IU/L   AST 34 0 - 40 IU/L   ALT 12 0 - 32 IU/L  Basic metabolic panel     Status: None   Collection Time: 06/16/17  8:06 AM  Result Value Ref Range   Glucose 95 65 - 99 mg/dL   BUN 19 6 - 24 mg/dL   Creatinine, Ser 0.97 0.57 - 1.00 mg/dL   GFR calc non Af Amer 66 >59 mL/min/1.73   GFR calc Af Amer 76 >59 mL/min/1.73   BUN/Creatinine Ratio 20 9 - 23   Sodium 142 134 - 144 mmol/L   Potassium 4.5 3.5 - 5.2 mmol/L   Chloride 101 96 - 106 mmol/L   CO2 25 20 - 29 mmol/L    Comment:               **Please note reference interval change**   Calcium 9.4 8.7 - 10.2 mg/dL  CBC with Differential/Platelet     Status: Abnormal   Collection Time: 06/16/17 10:22 AM  Result Value Ref Range   WBC 4.5 3.4 - 10.8 x10E3/uL   RBC 5.37 (H) 3.77 - 5.28 x10E6/uL   Hemoglobin 14.9 11.1 - 15.9 g/dL   Hematocrit 46.7 (H) 34.0 -  46.6 %   MCV 87 79 - 97 fL   MCH 27.7 26.6 - 33.0 pg   MCHC 31.9 31.5 - 35.7 g/dL   RDW 13.9 12.3 - 15.4 %   Platelets 188 150 - 379 x10E3/uL   Neutrophils 44 Not Estab. %   Lymphs 49 Not Estab. %   Monocytes 4 Not Estab. %   Eos 3 Not Estab. %   Basos 0 Not Estab. %   Neutrophils Absolute 2.0 1.4 - 7.0 x10E3/uL   Lymphocytes Absolute 2.2 0.7 - 3.1 x10E3/uL   Monocytes Absolute 0.2 0.1 - 0.9 x10E3/uL   EOS (ABSOLUTE) 0.2 0.0 - 0.4 x10E3/uL   Basophils Absolute 0.0 0.0 - 0.2 x10E3/uL   Immature Granulocytes 0 Not Estab. %   Immature Grans (Abs) 0.0 0.0 - 0.1 x10E3/uL

## 2017-06-29 ENCOUNTER — Other Ambulatory Visit (HOSPITAL_COMMUNITY)
Admission: RE | Admit: 2017-06-29 | Discharge: 2017-06-29 | Disposition: A | Discharge status: Home / Self Care | Payer: BLUE CROSS/BLUE SHIELD | Source: Ambulatory Visit | Attending: Obstetrics & Gynecology | Admitting: Obstetrics & Gynecology

## 2017-06-29 ENCOUNTER — Encounter: Payer: Self-pay | Admitting: Obstetrics & Gynecology

## 2017-06-29 ENCOUNTER — Ambulatory Visit (INDEPENDENT_AMBULATORY_CARE_PROVIDER_SITE_OTHER): Payer: BLUE CROSS/BLUE SHIELD | Admitting: Obstetrics & Gynecology

## 2017-06-29 VITALS — BP 90/60 | HR 82 | Ht 68.0 in | Wt 166.0 lb

## 2017-06-29 DIAGNOSIS — Z01419 Encounter for gynecological examination (general) (routine) without abnormal findings: Secondary | ICD-10-CM | POA: Diagnosis not present

## 2017-06-29 NOTE — Addendum Note (Signed)
Addended by: Diona Fanti A on: 06/29/2017 11:12 AM   Modules accepted: Orders

## 2017-06-29 NOTE — Progress Notes (Signed)
Subjective:     Sarah Mason is a 56 y.o. female here for a routine exam.  No LMP recorded. Patient is not currently having periods (Reason: Perimenopausal). [id number] Birth Control Method:  Post menopausal Menstrual Calendar(currently): amenorrheic  Current complaints: none  It is noted that Hava had an endometrial biopsy for perimenopausal/postmenopausal bleeding in March 2016 At that time her biopsy revealed proliferative endometrium without atypia and so I placed her on Prometrium cyclically every 3 months and she's not had a period since last May So based on our last deal that we made if she went a year without any bleeding she could stop Prometrium and so she can now stop her Prometrium She understands that she still could potentially have a bleeding episode but this is a good time is any to cut off the cyclical progestational therapy.   Current acute medical issues:  none   Recent Gynecologic History No LMP recorded. Patient is not currently having periods (Reason: Perimenopausal). Last Pap: 2016,  normal Last mammogram: 2017,  normal  Past Medical History:  Diagnosis Date  . Abnormal Pap smear   . Bilateral ovarian cysts 12/08/2014  . Exercise-induced asthma   . Fibroids   . HSV (herpes simplex virus) infection   . Hypertension   . Plantar fibromatosis   . Thickened endometrium 12/08/2014  . Vaginal Pap smear, abnormal     Past Surgical History:  Procedure Laterality Date  . CARPAL TUNNEL RELEASE Left   . COLONOSCOPY N/A 11/14/2013   Procedure: COLONOSCOPY;  Surgeon: Danie Binder, MD;  Location: [facility];  Service: Endoscopy;  Laterality: N/A;  10:30 AM  . Dilatation and curettage    . LASER ABLATION OF THE CERVIX    . ORIF WRIST FRACTURE Left 01/02/2015   Procedure: OPEN REDUCTION INTERNAL FIXATION LEFT WRIST FRACTURE;  Surgeon: Roseanne Kaufman, MD;  Location: [facility];  Service: Orthopedics;  Laterality: Left;    OB History    Gravida Para Term Preterm AB Living    4 2     2 2    SAB TAB Ectopic Multiple Live Births   1 1     2       Social History   Social History  . Marital status: Married    Spouse name: N/A  . Number of children: N/A  . Years of education: college 4y   Occupational History  . Homemaker Unemployed   Social History Main Topics  . Smoking status: Former Smoker    Types: Cigarettes  . Smokeless tobacco: Never Used     Comment: Only in highschool  . Alcohol use Yes     Comment: One to two drinks per week  . Drug use: No  . Sexual activity: Yes    Birth control/ protection: None, Surgical     Comment: vasectomy   Other Topics Concern  . None   Social History Narrative  . None    Family History  Problem Relation Age of Onset  . Diabetes Father   . Hypertension Father   . Hyperlipidemia Father   . Heart attack Father        In his 12's  . Lung disease Father        copd  . Diabetes Mother   . Hypertension Mother   . Hyperlipidemia Mother   . Other Mother        a fib  . Arthritis Paternal Grandfather   . Cancer Paternal Grandfather  lung  . Arthritis Paternal Grandmother   . Stroke Paternal Grandmother   . Heart disease Maternal Grandmother   . Arthritis Maternal Grandmother   . Arthritis Maternal Grandfather   . Lung disease Maternal Grandfather      Current Outpatient Prescriptions:  .  enalapril (VASOTEC) 20 MG tablet, Take 1 tablet (20 mg total) by mouth 2 (two) times daily., Disp: 60 tablet, Rfl: 5 .  fexofenadine (ALLEGRA) 180 MG tablet, Take 180 mg by mouth daily., Disp: , Rfl:  .  Fish Oil OIL, by Does not apply route., Disp: , Rfl:  .  Multiple Vitamin (MULTIVITAMIN) tablet, Take 1 tablet by mouth daily.  , Disp: , Rfl:  .  EPINEPHrine (EPIPEN 2-PAK) 0.3 mg/0.3 mL IJ SOAJ injection, Inject 0.3 mLs (0.3 mg total) into the muscle once. (Patient not taking: Reported on 06/29/2017), Disp: 2 Device, Rfl: 0 .  ibuprofen (ADVIL,MOTRIN) 200 MG tablet, Take 200 mg by mouth every 6 (six) hours as  needed., Disp: , Rfl:  .  progesterone (PROMETRIUM) 200 MG capsule, take 1 capsule at bedtime (Patient not taking: Reported on 06/18/2017), Disp: 30 capsule, Rfl: 11  Review of Systems  Review of Systems  Constitutional: Negative for fever, chills, weight loss, malaise/fatigue and diaphoresis.  HENT: Negative for hearing loss, ear pain, nosebleeds, congestion, sore throat, neck pain, tinnitus and ear discharge.   Eyes: Negative for blurred vision, double vision, photophobia, pain, discharge and redness.  Respiratory: Negative for cough, hemoptysis, sputum production, shortness of breath, wheezing and stridor.   Cardiovascular: Negative for chest pain, palpitations, orthopnea, claudication, leg swelling and PND.  Gastrointestinal: negative for abdominal pain. Negative for heartburn, nausea, vomiting, diarrhea, constipation, blood in stool and melena.  Genitourinary: Negative for dysuria, urgency, frequency, hematuria and flank pain.  Musculoskeletal: Negative for myalgias, back pain, joint pain and falls.  Skin: Negative for itching and rash.  Neurological: Negative for dizziness, tingling, tremors, sensory change, speech change, focal weakness, seizures, loss of consciousness, weakness and headaches.  Endo/Heme/Allergies: Negative for environmental allergies and polydipsia. Does not bruise/bleed easily.  Psychiatric/Behavioral: Negative for depression, suicidal ideas, hallucinations, memory loss and substance abuse. The patient is not nervous/anxious and does not have insomnia.        Objective:  Blood pressure 90/60, pulse 82, height 5\' 8"  (1.727 m), weight 166 lb (75.3 kg).   Physical Exam  Vitals reviewed. Constitutional: She is oriented to person, place, and time. She appears well-developed and well-nourished.  HENT:  Head: Normocephalic and atraumatic.        Right Ear: External ear normal.  Left Ear: External ear normal.  Nose: Nose normal.  Mouth/Throat: Oropharynx is clear and  moist.  Eyes: Conjunctivae and EOM are normal. Pupils are equal, round, and reactive to light. Right eye exhibits no discharge. Left eye exhibits no discharge. No scleral icterus.  Neck: Normal range of motion. Neck supple. No tracheal deviation present. No thyromegaly present.  Cardiovascular: Normal rate, regular rhythm, normal heart sounds and intact distal pulses.  Exam reveals no gallop and no friction rub.   No murmur heard. Respiratory: Effort normal and breath sounds normal. No respiratory distress. She has no wheezes. She has no rales. She exhibits no tenderness.  GI: Soft. Bowel sounds are normal. She exhibits no distension and no mass. There is no tenderness. There is no rebound and no guarding.  Genitourinary:  Breasts no masses skin changes or nipple changes bilaterally      Vulva is normal without lesions  Vagina is pink moist without discharge Cervix normal in appearance and pap is done Uterus is normal size shape and contour Adnexa is negative with normal sized ovaries   Musculoskeletal: Normal range of motion. She exhibits no edema and no tenderness.  Neurological: She is alert and oriented to person, place, and time. She has normal reflexes. She displays normal reflexes. No cranial nerve deficit. She exhibits normal muscle tone. Coordination normal.  Skin: Skin is warm and dry. No rash noted. No erythema. No pallor.  Psychiatric: She has a normal mood and affect. Her behavior is normal. Judgment and thought content normal.       Medications Ordered at today's visit: No orders of the defined types were placed in this encounter.   Other orders placed at today's visit: No orders of the defined types were placed in this encounter.     Assessment:    Healthy female exam.    Plan:    Mammogram ordered. Follow up in: 2 years.     Return in about 2 years (around 06/30/2019) for yearly, with Dr Elonda Husky.

## 2017-07-01 LAB — CYTOLOGY - PAP
DIAGNOSIS: NEGATIVE
HPV: NOT DETECTED

## 2017-08-25 ENCOUNTER — Other Ambulatory Visit: Payer: Self-pay | Admitting: Obstetrics and Gynecology

## 2017-08-25 DIAGNOSIS — Z1231 Encounter for screening mammogram for malignant neoplasm of breast: Secondary | ICD-10-CM

## 2017-08-26 ENCOUNTER — Ambulatory Visit (HOSPITAL_COMMUNITY): Payer: BLUE CROSS/BLUE SHIELD

## 2017-09-14 ENCOUNTER — Ambulatory Visit (HOSPITAL_COMMUNITY)
Admission: RE | Admit: 2017-09-14 | Discharge: 2017-09-14 | Disposition: A | Discharge status: Home / Self Care | Payer: BLUE CROSS/BLUE SHIELD | Source: Ambulatory Visit | Attending: Obstetrics and Gynecology | Admitting: Obstetrics and Gynecology

## 2017-09-14 DIAGNOSIS — Z1231 Encounter for screening mammogram for malignant neoplasm of breast: Secondary | ICD-10-CM | POA: Insufficient documentation

## 2017-12-30 ENCOUNTER — Other Ambulatory Visit: Payer: Self-pay | Admitting: Family Medicine

## 2018-01-25 ENCOUNTER — Encounter: Payer: Self-pay | Admitting: Family Medicine

## 2018-01-25 ENCOUNTER — Ambulatory Visit (INDEPENDENT_AMBULATORY_CARE_PROVIDER_SITE_OTHER): Payer: Managed Care, Other (non HMO) | Admitting: Family Medicine

## 2018-01-25 VITALS — BP 130/80 | Ht 68.0 in | Wt 162.8 lb

## 2018-01-25 DIAGNOSIS — I10 Essential (primary) hypertension: Secondary | ICD-10-CM | POA: Diagnosis not present

## 2018-01-25 NOTE — Progress Notes (Signed)
   Subjective:    Patient ID: Sarah Mason, female    DOB: November 07, 1961, 57 y.o.   MRN: [id number]  Hypertension  This is a chronic problem. The current episode started more than 1 year ago. Treatments tried: vasotec. There are no compliance problems.    Blood pressure medicine and blood pressure levels reviewed today with patient. Compliant with blood pressure medicine. States does not miss a dose. No obvious side effects. Blood pressure generally good when checked elsewhere. Watching salt intake.  Reg with meds, does not miss, nore on plant based food,     execise doing classical dvd ea morn , about twenty minutes       Review of Systems No headache, no major weight loss or weight gain, no chest pain no back pain abdominal pain no change in bowel habits complete ROS otherwise negative     Objective:   Physical Exam  Alert vitals stable, NAD. Blood pressure good on repeat. HEENT normal. Lungs clear. Heart regular rate and rhythm.       Assessment & Plan:  Impression essential hypertension.  Good control discussed to maintain same diet exercise discussed medications refilled.  Recheck in 6 months blood work done

## 2018-02-10 ENCOUNTER — Other Ambulatory Visit: Payer: Self-pay | Admitting: Family Medicine

## 2018-03-11 ENCOUNTER — Telehealth: Payer: Self-pay | Admitting: Family Medicine

## 2018-03-11 MED ORDER — ENALAPRIL MALEATE 20 MG PO TABS: 20.0000 mg | ORAL_TABLET | Freq: Two times a day (BID) | ORAL | 0 refills | Status: DC

## 2018-03-11 NOTE — Telephone Encounter (Signed)
Pt is requesting 3 month refills on  enalapril (VASOTEC) 20 MG tablet    [facility] [place]

## 2018-03-11 NOTE — Telephone Encounter (Signed)
Prescription sent electronically to pharmacy. Patient notified. 

## 2018-05-24 ENCOUNTER — Telehealth: Payer: Self-pay | Admitting: *Deleted

## 2018-05-24 ENCOUNTER — Other Ambulatory Visit: Payer: Self-pay

## 2018-05-24 MED ORDER — ENALAPRIL MALEATE 20 MG PO TABS: 20.0000 mg | ORAL_TABLET | Freq: Two times a day (BID) | ORAL | 1 refills | Status: DC

## 2018-05-24 NOTE — Telephone Encounter (Signed)
Patient called requesting a refill on her blood pressure medication, patient states it is always a conflict when it is time to refill patient states the pharmacy will tell her no refills. Patient was last seen 01/2018 and has 6 month follow up scheduled for august. Please advise     [place]

## 2018-05-24 NOTE — Telephone Encounter (Signed)
Enalapril sent in. I called and left a message to make sure her bp medication is all she is needing.

## 2018-05-25 NOTE — Telephone Encounter (Signed)
Pt notified and bp med is all that she needed.

## 2018-06-02 ENCOUNTER — Telehealth: Payer: Self-pay | Admitting: Family Medicine

## 2018-06-02 ENCOUNTER — Other Ambulatory Visit: Payer: Self-pay | Admitting: Family Medicine

## 2018-06-02 MED ORDER — ENALAPRIL MALEATE 20 MG PO TABS: 20.0000 mg | ORAL_TABLET | Freq: Two times a day (BID) | ORAL | 1 refills | Status: DC

## 2018-06-02 NOTE — Telephone Encounter (Signed)
Patient is aware we have sent in to the pharmacy requested.

## 2018-06-02 NOTE — Telephone Encounter (Signed)
Requesting refill on Enalapril to her new pharmacy [facility].

## 2018-08-03 ENCOUNTER — Encounter: Payer: Self-pay | Admitting: Family Medicine

## 2018-08-03 ENCOUNTER — Ambulatory Visit (INDEPENDENT_AMBULATORY_CARE_PROVIDER_SITE_OTHER): Payer: Managed Care, Other (non HMO) | Admitting: Family Medicine

## 2018-08-03 VITALS — BP 130/86 | Ht 68.0 in | Wt 160.8 lb

## 2018-08-03 DIAGNOSIS — I10 Essential (primary) hypertension: Secondary | ICD-10-CM | POA: Diagnosis not present

## 2018-08-03 MED ORDER — ENALAPRIL MALEATE 20 MG PO TABS: 20.0000 mg | ORAL_TABLET | Freq: Two times a day (BID) | ORAL | 0 refills | Status: DC

## 2018-08-03 NOTE — Patient Instructions (Addendum)
Come back in six months because we are always happy to see you!  We'll let you know blood results

## 2018-08-03 NOTE — Progress Notes (Signed)
   Subjective:    Patient ID: Sarah Mason, female    DOB: 07-17-1961, 57 y.o.   MRN: [id number]  Hypertension  This is a chronic problem. The current episode started more than 1 year ago. Risk factors for coronary artery disease include post-menopausal state. Treatments tried: enalapril. There are no compliance problems.     Blood pressure medicine and blood pressure levels reviewed today with patient. Compliant with blood pressure medicine. States does not miss a dose. No obvious side effects. Blood pressure generally good when checked elsewhere. Watching salt intake.  nt cke d lately  29 with ned s faith fully   Walking reg     Two weeks will bd doing major jhike      Review of Systems No headache, no major weight loss or weight gain, no chest pain no back pain abdominal pain no change in bowel habits complete ROS otherwise negative     Objective:   Physical Exam  Alert vitals stable, NAD. Blood pressure good on repeat. HEENT normal. Lungs clear. Heart regular rate and rhythm.       Assessment & Plan:  1 impression hypertension.  Good control.  Discussed.  Compliance discussed.  Diet and exercise discussed.  Appropriate blood work.  Recheck in 6 months.

## 2018-08-07 LAB — BASIC METABOLIC PANEL
BUN / CREAT RATIO: 17 (ref 9–23)
BUN: 15 mg/dL (ref 6–24)
CO2: 23 mmol/L (ref 20–29)
CREATININE: 0.88 mg/dL (ref 0.57–1.00)
Calcium: 9.4 mg/dL (ref 8.7–10.2)
Chloride: 104 mmol/L (ref 96–106)
GFR calc non Af Amer: 74 mL/min/{1.73_m2} (ref >59)
GFR, EST AFRICAN AMERICAN: 85 mL/min/{1.73_m2} (ref >59)
Glucose: 97 mg/dL (ref 65–99)
Potassium: 4.2 mmol/L (ref 3.5–5.2)
SODIUM: 141 mmol/L (ref 134–144)

## 2018-08-07 LAB — CBC WITH DIFFERENTIAL/PLATELET
BASOS: 1 %
Basophils Absolute: 0 10*3/uL (ref 0.0–0.2)
EOS (ABSOLUTE): 0.1 10*3/uL (ref 0.0–0.4)
Eos: 2 %
HEMOGLOBIN: 14.3 g/dL (ref 11.1–15.9)
Hematocrit: 40.7 % (ref 34.0–46.6)
IMMATURE GRANS (ABS): 0 10*3/uL (ref 0.0–0.1)
Immature Granulocytes: 0 %
LYMPHS ABS: 1.2 10*3/uL (ref 0.7–3.1)
Lymphs: 34 %
MCH: 29.4 pg (ref 26.6–33.0)
MCHC: 35.1 g/dL (ref 31.5–35.7)
MCV: 84 fL (ref 79–97)
Monocytes Absolute: 0.2 10*3/uL (ref 0.1–0.9)
Monocytes: 6 %
NEUTROS ABS: 2.1 10*3/uL (ref 1.4–7.0)
Neutrophils: 57 %
Platelets: 179 10*3/uL (ref 150–450)
RBC: 4.87 x10E6/uL (ref 3.77–5.28)
RDW: 14.8 % (ref 12.3–15.4)
WBC: 3.6 10*3/uL (ref 3.4–10.8)

## 2018-08-07 LAB — LIPID PANEL
CHOL/HDL RATIO: 3.4 ratio (ref 0.0–4.4)
CHOLESTEROL TOTAL: 196 mg/dL (ref 100–199)
HDL: 57 mg/dL (ref >39)
LDL CALC: 119 mg/dL — AB (ref 0–99)
Triglycerides: 100 mg/dL (ref 0–149)
VLDL CHOLESTEROL CAL: 20 mg/dL (ref 5–40)

## 2018-08-07 LAB — HEPATIC FUNCTION PANEL
ALT: 9 IU/L (ref 0–32)
AST: 31 IU/L (ref 0–40)
Albumin: 4.8 g/dL (ref 3.5–5.5)
Alkaline Phosphatase: 72 IU/L (ref 39–117)
BILIRUBIN TOTAL: 0.6 mg/dL (ref 0.0–1.2)
Bilirubin, Direct: 0.14 mg/dL (ref 0.00–0.40)
TOTAL PROTEIN: 7 g/dL (ref 6.0–8.5)

## 2018-08-08 ENCOUNTER — Encounter: Payer: Self-pay | Admitting: Family Medicine

## 2018-09-20 ENCOUNTER — Other Ambulatory Visit: Payer: Self-pay | Admitting: Obstetrics and Gynecology

## 2018-09-20 DIAGNOSIS — Z1231 Encounter for screening mammogram for malignant neoplasm of breast: Secondary | ICD-10-CM

## 2018-09-29 ENCOUNTER — Ambulatory Visit (HOSPITAL_COMMUNITY)
Admission: RE | Admit: 2018-09-29 | Discharge: 2018-09-29 | Disposition: A | Discharge status: Home / Self Care | Payer: Managed Care, Other (non HMO) | Source: Ambulatory Visit | Attending: Obstetrics and Gynecology | Admitting: Obstetrics and Gynecology

## 2018-09-29 DIAGNOSIS — Z1231 Encounter for screening mammogram for malignant neoplasm of breast: Secondary | ICD-10-CM | POA: Diagnosis present

## 2018-11-07 ENCOUNTER — Other Ambulatory Visit: Payer: Self-pay | Admitting: Family Medicine

## 2018-11-19 ENCOUNTER — Telehealth: Payer: Self-pay | Admitting: Family Medicine

## 2018-11-19 NOTE — Telephone Encounter (Signed)
rec pp d in sixx to 8 weeks, mostly to allay the opts concerns, very rare to catch it with such a brief exzposure, can be around anyone

## 2018-11-19 NOTE — Telephone Encounter (Signed)
Discussed with pt. pt verbalized understanding.  

## 2018-11-19 NOTE — Telephone Encounter (Signed)
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 Pt would like to know what does she need to do, her and her family are having thanksgiving lunch today @ 1pm and her father is a COPD pt so she hoping to hear back before he arrives.

## 2019-02-01 ENCOUNTER — Other Ambulatory Visit: Payer: Self-pay | Admitting: Family Medicine

## 2019-02-03 ENCOUNTER — Ambulatory Visit (INDEPENDENT_AMBULATORY_CARE_PROVIDER_SITE_OTHER): Payer: Managed Care, Other (non HMO) | Admitting: Family Medicine

## 2019-02-03 ENCOUNTER — Encounter: Payer: Self-pay | Admitting: Family Medicine

## 2019-02-03 VITALS — BP 150/94 | Ht 68.0 in | Wt 164.0 lb

## 2019-02-03 DIAGNOSIS — I10 Essential (primary) hypertension: Secondary | ICD-10-CM

## 2019-02-03 MED ORDER — ENALAPRIL MALEATE 20 MG PO TABS: 20.0000 mg | ORAL_TABLET | Freq: Two times a day (BID) | ORAL | 5 refills | Status: DC

## 2019-02-03 MED ORDER — AMLODIPINE BESYLATE 5 MG PO TABS: 5.0000 mg | ORAL_TABLET | Freq: Every day | ORAL | 5 refills | Status: DC

## 2019-02-03 NOTE — Progress Notes (Signed)
   Subjective:    Patient ID: Sarah Mason, female    DOB: 1961/07/08, 58 y.o.   MRN: [id number]  Hypertension  This is a chronic problem. Treatments tried: enalapril 20mg  bid. There are no compliance problems (takes meds every day but did miss last night dose due to emergency, eats healhy, exercises).    Having a lot of stress. Trying to help everybody.  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 Blood pressure improved though still not ideal on repeat. HEENT normal. Lungs clear. Heart regular rate and rhythm.      Assessment & Plan:  Impression hypertension.  Discussed.  Suboptimal control.  Will add additional medication.  Rationale discussed.  Diet exercise discussed.  Patient also notes considerable amount of stress the days with substantial illness on months family and friends.

## 2019-03-09 ENCOUNTER — Telehealth: Payer: Self-pay | Admitting: *Deleted

## 2019-03-09 ENCOUNTER — Telehealth: Payer: Managed Care, Other (non HMO) | Admitting: Family

## 2019-03-09 DIAGNOSIS — R6889 Other general symptoms and signs: Secondary | ICD-10-CM

## 2019-03-09 MED ORDER — OSELTAMIVIR PHOSPHATE 75 MG PO CAPS: 75.0000 mg | ORAL_CAPSULE | Freq: Two times a day (BID) | ORAL | 0 refills | Status: DC

## 2019-03-09 NOTE — Telephone Encounter (Signed)
From: Sarah Mason  Sent: 03/09/2019  9:17 AM EDT  To: [facility] Admin Pool  Subject: Appointment Request (HM)               Appointment Request From: Sarah Mason    With Provider: Mickie Hillier, MD [facility]]    Preferred Date Range: Any date 03/09/2019 or later    Preferred Times: Any    Reason: To address the following health maintenance concerns.  Hepatitis C Screening  Influenza Vaccine    Comments:  I think I have flu A, but I'm not sure. I returned from [place] on Monday. Fever 101.1, body aches, cough.   Called pt and she states she did an e -visit and was prescribed tamiflu and she will call us back if she needs anything.

## 2019-03-09 NOTE — Progress Notes (Signed)
E visit for Flu like symptoms   We are sorry that you are not feeling well.  Here is how we plan to help! Based on what you have shared with me it looks like you may have a respiratory virus that may be influenza.   It is very important that you self isolate yourself for the next 14 days and avoid all contact with anyone over the age of 20 years or older.  Approximately 5 minutes was spent documenting and reviewing patient's chart.    Influenza or "the flu" is   an infection caused by a respiratory virus. The flu virus is highly contagious and persons who did not receive their yearly flu vaccination may "catch" the flu from close contact.  We have anti-viral medications to treat the viruses that cause this infection. They are not a "cure" and only shorten the course of the infection. These prescriptions are most effective when they are given within the first 2 days of "flu" symptoms. Antiviral medication are indicated if you have a high risk of complications from the flu. You should  also consider an antiviral medication if you are in close contact with someone who is at risk. These medications can help patients avoid complications from the flu  but have side effects that you should know. Possible side effects from Tamiflu or oseltamivir include nausea, vomiting, diarrhea, dizziness, headaches, eye redness, sleep problems or other respiratory symptoms. You should not take Tamiflu if you have an allergy to oseltamivir or any to the ingredients in Tamiflu.  Based upon your symptoms and potential risk factors I have prescribed Oseltamivir (Tamiflu).  It has been sent to your designated pharmacy.  You will take one 75 mg capsule orally twice a day for the next 5 days.  ANYONE WHO HAS FLU SYMPTOMS SHOULD: . Stay home. The flu is highly contagious and going out or to work exposes others! . Be sure to drink plenty of fluids. Water is fine as well as fruit juices, sodas and electrolyte beverages. You may  want to stay away from caffeine or alcohol. If you are nauseated, try taking small sips of liquids. How do you know if you are getting enough fluid? Your urine should be a pale yellow or almost colorless. . Get rest. . Taking a steamy shower or using a humidifier may help nasal congestion and ease sore throat pain. Using a saline nasal spray works much the same way. . Cough drops, hard candies and sore throat lozenges may ease your cough. . Line up a caregiver. Have someone check on you regularly.   GET HELP RIGHT AWAY IF: . You cannot keep down liquids or your medications. . You become short of breath . Your fell like you are going to pass out or loose consciousness. . Your symptoms persist after you have completed your treatment plan MAKE SURE YOU   Understand these instructions.  Will watch your condition.  Will get help right away if you are not doing well or get worse.  Your e-visit answers were reviewed by a board certified advanced clinical practitioner to complete your personal care plan.  Depending on the condition, your plan could have included both over the counter or prescription medications.  If there is a problem please reply  once you have received a response from your provider.  Your safety is important to [place].  If you have drug allergies check your prescription carefully.    You can use MyChart to ask questions about today's  visit, request a non-urgent call back, or ask for a work or school excuse for 24 hours related to this e-Visit. If it has been greater than 24 hours you will need to follow up with your provider, or enter a new e-Visit to address those concerns.  You will get an e-mail in the next two days asking about your experience.  I hope that your e-visit has been valuable and will speed your recovery. Thank you for using e-visits.

## 2019-03-10 ENCOUNTER — Ambulatory Visit: Payer: Managed Care, Other (non HMO) | Admitting: Family Medicine

## 2019-03-10 ENCOUNTER — Other Ambulatory Visit: Payer: Self-pay

## 2019-03-10 ENCOUNTER — Ambulatory Visit (INDEPENDENT_AMBULATORY_CARE_PROVIDER_SITE_OTHER): Payer: Managed Care, Other (non HMO) | Admitting: Family Medicine

## 2019-03-10 DIAGNOSIS — R6889 Other general symptoms and signs: Secondary | ICD-10-CM | POA: Diagnosis not present

## 2019-03-10 DIAGNOSIS — R51 Headache: Secondary | ICD-10-CM | POA: Diagnosis not present

## 2019-03-10 DIAGNOSIS — R05 Cough: Secondary | ICD-10-CM

## 2019-03-10 DIAGNOSIS — R509 Fever, unspecified: Secondary | ICD-10-CM

## 2019-03-10 LAB — POCT INFLUENZA A/B
Influenza A, POC: NEGATIVE
Influenza B, POC: NEGATIVE

## 2019-03-10 NOTE — Progress Notes (Signed)
   Subjective:    Patient ID: Sarah Mason, female    DOB: October 14, 1961, 58 y.o.   MRN: [id number]  Influenza  Episode onset: started Tuesday with chills and fatigue, temp yesterday morning. Associated symptoms include congestion and coughing. Pertinent negatives include no chest pain or fever. Associated symptoms comments: TEMP OF 101, ACHES, COUGH, HEADACHE, RUNNY NOSE. Treatments tried: Tamiflu- 2 tablets yesterday. The treatment provided mild relief.    Temp is down today and aches are better. Pt did E visit yesterday and was prescribed Tamiflu. Must have flu test in order for husband to return back to work   Review of Systems  Constitutional: Negative for activity change and fever.  HENT: Positive for congestion and rhinorrhea. Negative for ear pain.   Eyes: Negative for discharge.  Respiratory: Positive for cough. Negative for shortness of breath and wheezing.   Cardiovascular: Negative for chest pain.       Objective:   Physical Exam Vitals signs and nursing note reviewed.  Constitutional:      Appearance: She is well-developed.  HENT:     Head: Normocephalic.     Nose: Nose normal.     Mouth/Throat:     Pharynx: No oropharyngeal exudate.  Neck:     Musculoskeletal: Neck supple.  Cardiovascular:     Rate and Rhythm: Normal rate.     Heart sounds: Normal heart sounds. No murmur.  Pulmonary:     Effort: Pulmonary effort is normal.     Breath sounds: Normal breath sounds. No wheezing.  Lymphadenopathy:     Cervical: No cervical adenopathy.  Skin:    General: Skin is warm and dry.    Patient denies any shortness of breath O2 saturation 97% she denies any chest congestion or pressure  Patient educated regarding coronavirus     Assessment & Plan:  Has flulike symptoms Started Tamiflu yesterday No respiratory distress Rapid flu test is negative I doubt that this is coronavirus but she did travel to [place] If she has progressive troubles she needs to go to the ER  for further testing Warning signs discussed.

## 2019-03-11 NOTE — Telephone Encounter (Signed)
Error

## 2019-03-28 ENCOUNTER — Encounter: Payer: Self-pay | Admitting: Family Medicine

## 2019-03-28 NOTE — Telephone Encounter (Signed)
Dr. Jeannine Kitten patient thank you

## 2019-07-25 ENCOUNTER — Other Ambulatory Visit: Payer: Self-pay | Admitting: Family Medicine

## 2019-08-02 ENCOUNTER — Other Ambulatory Visit: Payer: Managed Care, Other (non HMO) | Admitting: Adult Health

## 2019-08-29 ENCOUNTER — Other Ambulatory Visit: Payer: Self-pay | Admitting: Family Medicine

## 2019-08-31 ENCOUNTER — Other Ambulatory Visit: Payer: Self-pay | Admitting: Family Medicine

## 2019-09-07 ENCOUNTER — Other Ambulatory Visit: Payer: Self-pay | Admitting: Family Medicine

## 2019-10-01 ENCOUNTER — Other Ambulatory Visit: Payer: Self-pay | Admitting: Family Medicine

## 2019-10-03 NOTE — Telephone Encounter (Signed)
Contact pt to set up appt; then may route to nurses. Thank you

## 2019-10-06 ENCOUNTER — Encounter: Payer: Self-pay | Admitting: Adult Health

## 2019-10-06 ENCOUNTER — Other Ambulatory Visit: Payer: Self-pay

## 2019-10-06 ENCOUNTER — Ambulatory Visit (INDEPENDENT_AMBULATORY_CARE_PROVIDER_SITE_OTHER): Payer: Managed Care, Other (non HMO) | Admitting: Adult Health

## 2019-10-06 VITALS — BP 116/75 | HR 72 | Ht 66.25 in | Wt 166.0 lb

## 2019-10-06 DIAGNOSIS — Z01419 Encounter for gynecological examination (general) (routine) without abnormal findings: Secondary | ICD-10-CM | POA: Insufficient documentation

## 2019-10-06 DIAGNOSIS — Z1212 Encounter for screening for malignant neoplasm of rectum: Secondary | ICD-10-CM

## 2019-10-06 DIAGNOSIS — Z1211 Encounter for screening for malignant neoplasm of colon: Secondary | ICD-10-CM | POA: Insufficient documentation

## 2019-10-06 LAB — HEMOCCULT GUIAC POC 1CARD (OFFICE): Fecal Occult Blood, POC: NEGATIVE

## 2019-10-06 NOTE — Progress Notes (Signed)
Patient ID: Sarah Mason, female   DOB: 1961-06-07, 58 y.o.   MRN: [id number] History of Present Illness: Sarah Mason is a 58 year old white female, married, PM in for a well woman gyn exam, she had a normal pap with negative HPV 06/29/17.She is active, she did pilgrimage  in [place]. PCP is Mickie Hillier    Current Medications, Allergies, Past Medical History, Past Surgical History, Family History and Social History were reviewed in [employer] record.     Review of Systems: Patient denies any headaches, hearing loss, fatigue, blurred vision, shortness of breath, chest pain, abdominal pain, problems with bowel movements, urination, or intercourse. No joint pain or mood swings.    Physical Exam:BP 116/75 (BP Location: Left Arm, Patient Position: Sitting, Cuff Size: Normal)   Pulse 72   Ht 5' 6.25" (1.683 m)   Wt 166 lb (75.3 kg)   LMP 12/11/2014 (Approximate)   BMI 26.59 kg/m  General:  Well developed, well nourished, no acute distress Skin:  Warm and dry Neck:  Midline trachea, normal thyroid, good ROM, no lymphadenopathy Lungs; Clear to auscultation bilaterally Breast:  No dominant palpable mass, retraction, or nipple discharge, numerous AKs on breast to see dermatologist  Cardiovascular: Regular rate and rhythm Abdomen:  Soft, non tender, no hepatosplenomegaly Pelvic:  External genitalia is normal in appearance, no lesions.  The vagina is normal in appearance for age, has loss of color, moisture and rugae. Urethra has no lesions or masses. The cervix is smooth.  Uterus is felt to be normal size, shape, and contour.  No adnexal masses or tenderness noted.Bladder is non tender, no masses felt. Rectal: Good sphincter tone, no polyps, or hemorrhoids felt.  Hemoccult negative. Extremities/musculoskeletal:  No swelling or varicosities noted, no clubbing or cyanosis Psych:  No mood changes, alert and cooperative,seems happy Fall risk is moderate PHQ 2 score 1 Examination  chaperoned by Weyman Croon FNP student  Impression: 1. Encounter for well woman exam with routine gynecological exam -pap and physical in 1 year -mammogram yearly -labs with PCP -keep being active  2. Screening for colorectal cancer

## 2019-10-10 ENCOUNTER — Encounter: Payer: Self-pay | Admitting: Family Medicine

## 2019-10-10 ENCOUNTER — Ambulatory Visit (INDEPENDENT_AMBULATORY_CARE_PROVIDER_SITE_OTHER): Payer: Managed Care, Other (non HMO) | Admitting: Family Medicine

## 2019-10-10 VITALS — BP 115/75

## 2019-10-10 DIAGNOSIS — Z13220 Encounter for screening for lipoid disorders: Secondary | ICD-10-CM | POA: Diagnosis not present

## 2019-10-10 DIAGNOSIS — I10 Essential (primary) hypertension: Secondary | ICD-10-CM

## 2019-10-10 MED ORDER — AMLODIPINE BESYLATE 5 MG PO TABS: ORAL_TABLET | ORAL | 5 refills | Status: DC

## 2019-10-10 MED ORDER — ENALAPRIL MALEATE 20 MG PO TABS: ORAL_TABLET | ORAL | 5 refills | Status: DC

## 2019-10-10 NOTE — Progress Notes (Signed)
   Subjective:  Audio plus video  Patient ID: Sarah Mason, female    DOB: 1961/04/14, 58 y.o.   MRN: [id number]  Hypertension This is a chronic problem. The current episode started more than 1 year ago. Risk factors for coronary artery disease include post-menopausal state. Treatments tried: vasotec and norvasc. There are no compliance problems.       Review of Systems  Virtual Visit via Video Note  I connected with Sarah Mason on 10/10/19 at 11:00 AM EDT by a video enabled telemedicine application and verified that I am speaking with the correct person using two identifiers.  Location: Patient: home Provider: office   I discussed the limitations of evaluation and management by telemedicine and the availability of in person appointments. The patient expressed understanding and agreed to proceed.  History of Present Illness:    Observations/Objective:   Assessment and Plan:   Follow Up Instructions:    I discussed the assessment and treatment plan with the patient. The patient was provided an opportunity to ask questions and all were answered. The patient agreed with the plan and demonstrated an understanding of the instructions.   The patient was advised to call back or seek an in-person evaluation if the symptoms worsen or if the condition fails to improve as anticipated.  I provided 18 minutes of non-face-to-face time during this encounter.  Blood pressure medicine and blood pressure levels reviewed today with patient. Compliant with blood pressure medicine. States does not miss a dose. No obvious side effects. Blood pressure generally good when checked elsewhere. Watching salt intake.  Patient's blood pressure when checked elsewhere.  Regularly.  Exercising.      Objective:   Physical Exam  Virtual      Assessment & Plan:  Impression hypertension very good control discussed to maintain same meds.  Compliance discussed exercise and discussed appropriate blood work  further recommendations based on results

## 2019-10-12 NOTE — Telephone Encounter (Signed)
Pt had visit 10/10/2019 & states she thinks meds were already called

## 2019-10-26 LAB — BASIC METABOLIC PANEL
BUN/Creatinine Ratio: 20 (ref 9–23)
BUN: 17 mg/dL (ref 6–24)
CO2: 24 mmol/L (ref 20–29)
Calcium: 9.4 mg/dL (ref 8.7–10.2)
Chloride: 102 mmol/L (ref 96–106)
Creatinine, Ser: 0.83 mg/dL (ref 0.57–1.00)
GFR calc Af Amer: 91 mL/min/{1.73_m2} (ref >59)
GFR calc non Af Amer: 79 mL/min/{1.73_m2} (ref >59)
Glucose: 102 mg/dL — ABNORMAL HIGH (ref 65–99)
Potassium: 4.1 mmol/L (ref 3.5–5.2)
Sodium: 140 mmol/L (ref 134–144)

## 2019-10-26 LAB — HEPATIC FUNCTION PANEL
ALT: 14 IU/L (ref 0–32)
AST: 34 IU/L (ref 0–40)
Albumin: 4.7 g/dL (ref 3.8–4.9)
Alkaline Phosphatase: 97 IU/L (ref 39–117)
Bilirubin Total: 0.4 mg/dL (ref 0.0–1.2)
Bilirubin, Direct: 0.12 mg/dL (ref 0.00–0.40)
Total Protein: 7.2 g/dL (ref 6.0–8.5)

## 2019-10-26 LAB — LIPID PANEL
Chol/HDL Ratio: 3.5 ratio (ref 0.0–4.4)
Cholesterol, Total: 197 mg/dL (ref 100–199)
HDL: 57 mg/dL (ref >39)
LDL Chol Calc (NIH): 124 mg/dL — ABNORMAL HIGH (ref 0–99)
Triglycerides: 89 mg/dL (ref 0–149)
VLDL Cholesterol Cal: 16 mg/dL (ref 5–40)

## 2019-10-30 ENCOUNTER — Encounter: Payer: Self-pay | Admitting: Family Medicine

## 2019-10-31 ENCOUNTER — Other Ambulatory Visit (HOSPITAL_COMMUNITY): Payer: Self-pay | Admitting: Family Medicine

## 2019-10-31 DIAGNOSIS — Z1231 Encounter for screening mammogram for malignant neoplasm of breast: Secondary | ICD-10-CM

## 2019-11-21 ENCOUNTER — Ambulatory Visit (HOSPITAL_COMMUNITY)
Admission: RE | Admit: 2019-11-21 | Discharge: 2019-11-21 | Disposition: A | Discharge status: Home / Self Care | Payer: Managed Care, Other (non HMO) | Source: Ambulatory Visit | Attending: Family Medicine | Admitting: Family Medicine

## 2019-11-21 ENCOUNTER — Other Ambulatory Visit: Payer: Self-pay

## 2019-11-21 DIAGNOSIS — Z1231 Encounter for screening mammogram for malignant neoplasm of breast: Secondary | ICD-10-CM | POA: Insufficient documentation

## 2019-12-13 ENCOUNTER — Ambulatory Visit: Payer: Managed Care, Other (non HMO) | Attending: Internal Medicine

## 2019-12-13 ENCOUNTER — Other Ambulatory Visit: Payer: Self-pay

## 2019-12-13 DIAGNOSIS — Z20822 Contact with and (suspected) exposure to covid-19: Secondary | ICD-10-CM

## 2019-12-15 LAB — NOVEL CORONAVIRUS, NAA: SARS-CoV-2, NAA: NOT DETECTED

## 2019-12-19 ENCOUNTER — Ambulatory Visit: Payer: Managed Care, Other (non HMO) | Attending: Internal Medicine

## 2019-12-19 ENCOUNTER — Other Ambulatory Visit: Payer: Self-pay

## 2019-12-19 DIAGNOSIS — Z20822 Contact with and (suspected) exposure to covid-19: Secondary | ICD-10-CM

## 2019-12-21 LAB — NOVEL CORONAVIRUS, NAA: SARS-CoV-2, NAA: NOT DETECTED

## 2020-01-25 ENCOUNTER — Encounter: Payer: Self-pay | Admitting: Family Medicine

## 2020-04-04 ENCOUNTER — Other Ambulatory Visit: Payer: Self-pay | Admitting: Family Medicine

## 2020-04-04 NOTE — Telephone Encounter (Signed)
Ok times one with one ref , rec six mo ck up with me or dr Lovena Le

## 2020-04-04 NOTE — Telephone Encounter (Signed)
Scheduled 5/5

## 2020-04-11 ENCOUNTER — Other Ambulatory Visit: Payer: Self-pay | Admitting: Family Medicine

## 2020-04-12 NOTE — Telephone Encounter (Signed)
Pt has med check on 5/4 and is hoping this can be sent in today she is going out of town.

## 2020-04-24 ENCOUNTER — Encounter: Payer: Self-pay | Admitting: Family Medicine

## 2020-04-24 ENCOUNTER — Other Ambulatory Visit: Payer: Self-pay

## 2020-04-24 ENCOUNTER — Ambulatory Visit (INDEPENDENT_AMBULATORY_CARE_PROVIDER_SITE_OTHER): Payer: Managed Care, Other (non HMO) | Admitting: Family Medicine

## 2020-04-24 VITALS — BP 130/84 | HR 89 | Temp 95.5°F | Ht 65.5 in | Wt 170.8 lb

## 2020-04-24 DIAGNOSIS — I10 Essential (primary) hypertension: Secondary | ICD-10-CM | POA: Diagnosis not present

## 2020-04-24 DIAGNOSIS — R0602 Shortness of breath: Secondary | ICD-10-CM

## 2020-04-24 MED ORDER — ENALAPRIL MALEATE 20 MG PO TABS: 20.0000 mg | ORAL_TABLET | Freq: Two times a day (BID) | ORAL | 1 refills | Status: DC

## 2020-04-24 MED ORDER — AMLODIPINE BESYLATE 5 MG PO TABS: 5.0000 mg | ORAL_TABLET | Freq: Every day | ORAL | 1 refills | Status: DC

## 2020-04-24 NOTE — Progress Notes (Signed)
Patient ID: Sarah Mason, female    DOB: 01-06-1961, 59 y.o.   MRN: [id number]   Chief Complaint  Patient presents with  . Hypertension    Pt here today for med check. Pt does not check blood pressure on a regular basis. Pt states she does become "breathless" at times; pt states this has been going on for about a year. No headaches no blurred vision, no other symptoms. Pt is taking Amlodipine 5 mg and Enalapril 20 mg BID.    Subjective:    HPI Pt seen for breathing and for  F/u on HTN. Feeling breathless when bending over and tying shoes. With walking feeling sob at times.  Has been deconditioning. Exercising- walking 3-4 miles per day. Average 5 miles per day of walking. Doing walking for 2-3 yrs. Tried to increase walking this year. When going up hills feeling slightly short of breath.  Has h/o exercise induced asthma.  Not using an inhaler.  No coughing or wheezing. When allergies were bad was a little harder to do her walks uphill.   Pt checked BP at home yesterday was 114/80. Taking her medications as directed.  No chest pain, sob, headache, or blurry vision.   Medical History [name] has a past medical history of Abnormal Pap smear, Bilateral ovarian cysts (12/08/2014), Exercise-induced asthma, Fibroids, HSV (herpes simplex virus) infection, Hypertension, Plantar fibromatosis, Thickened endometrium (12/08/2014), and Vaginal Pap smear, abnormal.   Outpatient Encounter Medications as of 04/24/2020  Medication Sig  . amLODipine (NORVASC) 5 MG tablet Take 1 tablet (5 mg total) by mouth daily.  . Biotin w/ Vitamins C & E (HAIR/SKIN/NAILS PO) Take by mouth daily.  . enalapril (VASOTEC) 20 MG tablet Take 1 tablet (20 mg total) by mouth 2 (two) times daily.  . fexofenadine (ALLEGRA) 180 MG tablet Take 180 mg by mouth daily.  Marland Kitchen GLUCOSAMINE-CHONDROITIN DS PO Take by mouth daily.  . Multiple Vitamin (MULTIVITAMIN) tablet Take 1 tablet by mouth daily.    . [DISCONTINUED] amLODipine  (NORVASC) 5 MG tablet TAKE 1 TABLET(5 MG) BY MOUTH AT BEDTIME  . [DISCONTINUED] enalapril (VASOTEC) 20 MG tablet TAKE 1 TABLET(20 MG) BY MOUTH TWICE DAILY  . [DISCONTINUED] EPINEPHrine (EPIPEN 2-PAK) 0.3 mg/0.3 mL IJ SOAJ injection Inject 0.3 mLs (0.3 mg total) into the muscle once.   No facility-administered encounter medications on file as of 04/24/2020.     Review of Systems  Constitutional: Negative for chills and fever.  HENT: Negative for congestion, rhinorrhea and sore throat.   Respiratory: Positive for shortness of breath (with exertion going up hills). Negative for apnea, cough, chest tightness and wheezing.   Cardiovascular: Negative for chest pain and leg swelling.  Gastrointestinal: Negative for abdominal pain, diarrhea, nausea and vomiting.  Genitourinary: Negative for dysuria and frequency.  Musculoskeletal: Negative for arthralgias and back pain.  Skin: Negative for rash.  Neurological: Negative for dizziness, weakness and headaches.     Vitals BP 130/84   Pulse 89   Temp (!) 95.5 F (35.3 C)   Ht 5' 5.5" (1.664 m)   Wt 170 lb 12.8 oz (77.5 kg)   LMP 12/11/2014 (Approximate)   SpO2 98%   BMI 27.99 kg/m   Vitals:   04/24/20 0959 04/24/20 1024  BP: 136/82 130/84  Pulse: (!) 105 89  Temp: (!) 95.5 F (35.3 C)   SpO2: 98%     Objective:   Physical Exam Vitals and nursing note reviewed.  Constitutional:      Appearance: Normal appearance.  HENT:     Head: Normocephalic and atraumatic.     Nose: Nose normal.     Mouth/Throat:     Mouth: Mucous membranes are moist.     Pharynx: Oropharynx is clear.  Eyes:     Extraocular Movements: Extraocular movements intact.     Conjunctiva/sclera: Conjunctivae normal.     Pupils: Pupils are equal, round, and reactive to light.  Cardiovascular:     Rate and Rhythm: Normal rate and regular rhythm.     Pulses: Normal pulses.     Heart sounds: Normal heart sounds.  Pulmonary:     Effort: Pulmonary effort is  normal.     Breath sounds: Normal breath sounds. No wheezing, rhonchi or rales.  Musculoskeletal:        General: Normal range of motion.     Right lower leg: No edema.     Left lower leg: No edema.  Skin:    General: Skin is warm and dry.     Findings: No lesion or rash.  Neurological:     General: No focal deficit present.     Mental Status: She is alert and oriented to person, place, and time.  Psychiatric:        Mood and Affect: Mood normal.        Behavior: Behavior normal.      Assessment and Plan   1. Essential hypertension - amLODipine (NORVASC) 5 MG tablet; Take 1 tablet (5 mg total) by mouth daily.  Dispense: 90 tablet; Refill: 1 - enalapril (VASOTEC) 20 MG tablet; Take 1 tablet (20 mg total) by mouth 2 (two) times daily.  Dispense: 180 tablet; Refill: 1 - CMP14+EGFR; Future - Lipid panel; Future - CBC; Future  2. Short of breath on exertion   HTN- stable, cont meds.  Pt declined to try inhaler to help with sob with walking.  Has h/o exercise induced asthma.  Cont to monitor.   F/u 6 months for med check and labs or prn.   [place], DO 04/24/2020

## 2020-04-25 ENCOUNTER — Telehealth: Payer: Managed Care, Other (non HMO) | Admitting: Family Medicine

## 2020-04-26 ENCOUNTER — Telehealth: Payer: Self-pay | Admitting: Family Medicine

## 2020-04-26 ENCOUNTER — Other Ambulatory Visit: Payer: Self-pay | Admitting: *Deleted

## 2020-04-26 DIAGNOSIS — I10 Essential (primary) hypertension: Secondary | ICD-10-CM

## 2020-04-26 MED ORDER — ENALAPRIL MALEATE 20 MG PO TABS: 20.0000 mg | ORAL_TABLET | Freq: Two times a day (BID) | ORAL | 1 refills | Status: DC

## 2020-04-26 MED ORDER — AMLODIPINE BESYLATE 5 MG PO TABS: 5.0000 mg | ORAL_TABLET | Freq: Every day | ORAL | 1 refills | Status: DC

## 2020-04-26 NOTE — Telephone Encounter (Signed)
Pt was seen 5/4 and had medications sent to [facility]. She is needing them transferred to [facility] due to insurance.

## 2020-04-26 NOTE — Telephone Encounter (Signed)
Fyi - Pt states [facility] in not in network anymore and wanted amlodipine and enalapril switched to [facility]. You sent in a 6 month supply on 5/4 so I just switched them to [facility] [place] and pt was notified.

## 2020-11-01 ENCOUNTER — Other Ambulatory Visit (HOSPITAL_COMMUNITY): Payer: Self-pay | Admitting: Family Medicine

## 2020-11-01 DIAGNOSIS — Z1231 Encounter for screening mammogram for malignant neoplasm of breast: Secondary | ICD-10-CM

## 2020-11-08 ENCOUNTER — Ambulatory Visit (INDEPENDENT_AMBULATORY_CARE_PROVIDER_SITE_OTHER): Payer: 59 | Admitting: Family Medicine

## 2020-11-08 ENCOUNTER — Other Ambulatory Visit: Payer: Self-pay

## 2020-11-08 ENCOUNTER — Encounter: Payer: Self-pay | Admitting: Family Medicine

## 2020-11-08 VITALS — BP 124/82 | HR 91 | Temp 98.2°F | Ht 65.5 in | Wt 168.6 lb

## 2020-11-08 DIAGNOSIS — Z23 Encounter for immunization: Secondary | ICD-10-CM | POA: Diagnosis not present

## 2020-11-08 DIAGNOSIS — Z13220 Encounter for screening for lipoid disorders: Secondary | ICD-10-CM

## 2020-11-08 DIAGNOSIS — I10 Essential (primary) hypertension: Secondary | ICD-10-CM | POA: Diagnosis not present

## 2020-11-08 MED ORDER — ENALAPRIL MALEATE 20 MG PO TABS: 20.0000 mg | ORAL_TABLET | Freq: Two times a day (BID) | ORAL | 1 refills | Status: DC

## 2020-11-08 MED ORDER — AMLODIPINE BESYLATE 5 MG PO TABS: 5.0000 mg | ORAL_TABLET | Freq: Every day | ORAL | 1 refills | Status: DC

## 2020-11-08 NOTE — Patient Instructions (Signed)
DASH Eating Plan DASH stands for "Dietary Approaches to Stop Hypertension." The DASH eating plan is a healthy eating plan that has been shown to reduce high blood pressure (hypertension). It may also reduce your risk for type 2 diabetes, heart disease, and stroke. The DASH eating plan may also help with weight loss. What are tips for following this plan?  General guidelines  Avoid eating more than 2,300 mg (milligrams) of salt (sodium) a day. If you have hypertension, you may need to reduce your sodium intake to 1,500 mg a day.  Limit alcohol intake to no more than 1 drink a day for nonpregnant women and 2 drinks a day for men. One drink equals 12 oz of beer, 5 oz of wine, or 1 oz of hard liquor.  Work with your health care provider to maintain a healthy body weight or to lose weight. Ask what an ideal weight is for you.  Get at least 30 minutes of exercise that causes your heart to beat faster (aerobic exercise) most days of the week. Activities may include walking, swimming, or biking.  Work with your health care provider or diet and nutrition specialist (dietitian) to adjust your eating plan to your individual calorie needs. Reading food labels   Check food labels for the amount of sodium per serving. Choose foods with less than 5 percent of the Daily Value of sodium. Generally, foods with less than 300 mg of sodium per serving fit into this eating plan.  To find whole grains, look for the word "whole" as the first word in the ingredient list. Shopping  Buy products labeled as "low-sodium" or "no salt added."  Buy fresh foods. Avoid canned foods and premade or frozen meals. Cooking  Avoid adding salt when cooking. Use salt-free seasonings or herbs instead of table salt or sea salt. Check with your health care provider or pharmacist before using salt substitutes.  Do not fry foods. Cook foods using healthy methods such as baking, boiling, grilling, and broiling instead.  Cook with  heart-healthy oils, such as olive, canola, soybean, or sunflower oil. Meal planning  Eat a balanced diet that includes: ? 5 or more servings of fruits and vegetables each day. At each meal, try to fill half of your plate with fruits and vegetables. ? Up to 6-8 servings of whole grains each day. ? Less than 6 oz of lean meat, poultry, or fish each day. A 3-oz serving of meat is about the same size as a deck of cards. One egg equals 1 oz. ? 2 servings of low-fat dairy each day. ? A serving of nuts, seeds, or beans 5 times each week. ? Heart-healthy fats. Healthy fats called Omega-3 fatty acids are found in foods such as flaxseeds and coldwater fish, like sardines, salmon, and mackerel.  Limit how much you eat of the following: ? Canned or prepackaged foods. ? Food that is high in trans fat, such as fried foods. ? Food that is high in saturated fat, such as fatty meat. ? Sweets, desserts, sugary drinks, and other foods with added sugar. ? Full-fat dairy products.  Do not salt foods before eating.  Try to eat at least 2 vegetarian meals each week.  Eat more home-cooked food and less restaurant, buffet, and fast food.  When eating at a restaurant, ask that your food be prepared with less salt or no salt, if possible. What foods are recommended? The items listed may not be a complete list. Talk with your dietitian about   what dietary choices are best for you. Grains Whole-grain or whole-wheat bread. Whole-grain or whole-wheat pasta. Brown rice. Oatmeal. Quinoa. Bulgur. Whole-grain and low-sodium cereals. Pita bread. Low-fat, low-sodium crackers. Whole-wheat flour tortillas. Vegetables Fresh or frozen vegetables (raw, steamed, roasted, or grilled). Low-sodium or reduced-sodium tomato and vegetable juice. Low-sodium or reduced-sodium tomato sauce and tomato paste. Low-sodium or reduced-sodium canned vegetables. Fruits All fresh, dried, or frozen fruit. Canned fruit in natural juice (without  added sugar). Meat and other protein foods Skinless chicken or turkey. Ground chicken or turkey. Pork with fat trimmed off. Fish and seafood. Egg whites. Dried beans, peas, or lentils. Unsalted nuts, nut butters, and seeds. Unsalted canned beans. Lean cuts of beef with fat trimmed off. Low-sodium, lean deli meat. Dairy Low-fat (1%) or fat-free (skim) milk. Fat-free, low-fat, or reduced-fat cheeses. Nonfat, low-sodium ricotta or cottage cheese. Low-fat or nonfat yogurt. Low-fat, low-sodium cheese. Fats and oils Soft margarine without trans fats. Vegetable oil. Low-fat, reduced-fat, or light mayonnaise and salad dressings (reduced-sodium). Canola, safflower, olive, soybean, and sunflower oils. Avocado. Seasoning and other foods Herbs. Spices. Seasoning mixes without salt. Unsalted popcorn and pretzels. Fat-free sweets. What foods are not recommended? The items listed may not be a complete list. Talk with your dietitian about what dietary choices are best for you. Grains Baked goods made with fat, such as croissants, muffins, or some breads. Dry pasta or rice meal packs. Vegetables Creamed or fried vegetables. Vegetables in a cheese sauce. Regular canned vegetables (not low-sodium or reduced-sodium). Regular canned tomato sauce and paste (not low-sodium or reduced-sodium). Regular tomato and vegetable juice (not low-sodium or reduced-sodium). Pickles. Olives. Fruits Canned fruit in a light or heavy syrup. Fried fruit. Fruit in cream or butter sauce. Meat and other protein foods Fatty cuts of meat. Ribs. Fried meat. Bacon. Sausage. Bologna and other processed lunch meats. Salami. Fatback. Hotdogs. Bratwurst. Salted nuts and seeds. Canned beans with added salt. Canned or smoked fish. Whole eggs or egg yolks. Chicken or turkey with skin. Dairy Whole or 2% milk, cream, and half-and-half. Whole or full-fat cream cheese. Whole-fat or sweetened yogurt. Full-fat cheese. Nondairy creamers. Whipped toppings.  Processed cheese and cheese spreads. Fats and oils Butter. Stick margarine. Lard. Shortening. Ghee. Bacon fat. Tropical oils, such as coconut, palm kernel, or palm oil. Seasoning and other foods Salted popcorn and pretzels. Onion salt, garlic salt, seasoned salt, table salt, and sea salt. Worcestershire sauce. Tartar sauce. Barbecue sauce. Teriyaki sauce. Soy sauce, including reduced-sodium. Steak sauce. Canned and packaged gravies. Fish sauce. Oyster sauce. Cocktail sauce. Horseradish that you find on the shelf. Ketchup. Mustard. Meat flavorings and tenderizers. Bouillon cubes. Hot sauce and Tabasco sauce. Premade or packaged marinades. Premade or packaged taco seasonings. Relishes. Regular salad dressings. Where to find more information:  National Heart, Lung, and Blood Institute: www.nhlbi.nih.gov  American Heart Association: www.heart.org Summary  The DASH eating plan is a healthy eating plan that has been shown to reduce high blood pressure (hypertension). It may also reduce your risk for type 2 diabetes, heart disease, and stroke.  With the DASH eating plan, you should limit salt (sodium) intake to 2,300 mg a day. If you have hypertension, you may need to reduce your sodium intake to 1,500 mg a day.  When on the DASH eating plan, aim to eat more fresh fruits and vegetables, whole grains, lean proteins, low-fat dairy, and heart-healthy fats.  Work with your health care provider or diet and nutrition specialist (dietitian) to adjust your eating plan to your   individual calorie needs. This information is not intended to replace advice given to you by your health care provider. Make sure you discuss any questions you have with your health care provider. Document Revised: 11/20/2017 Document Reviewed: 12/01/2016 Elsevier Patient Education  2020 Elsevier Inc.  

## 2020-11-08 NOTE — Progress Notes (Signed)
Patient ID: Sarah Mason, female    DOB: 1961/05/15, 59 y.o.   MRN: [id number]   Chief Complaint  Patient presents with  . Hypertension    follow up   Subjective:  CC: medication recheck for blood pressure  Presents today for medication checkup for her blood pressure.  Blood pressure is 124/82 today.  It is noted that she had laboratory work ordered in May, unaware and did not get this done.  She denies chest pain, denies shortness of breath, no headaches no vision changes.    Medical History Sarah Mason has a past medical history of Abnormal Pap smear, Bilateral ovarian cysts (12/08/2014), Exercise-induced asthma, Fibroids, HSV (herpes simplex virus) infection, Hypertension, Plantar fibromatosis, Thickened endometrium (12/08/2014), and Vaginal Pap smear, abnormal.   Outpatient Encounter Medications as of 11/08/2020  Medication Sig  . amLODipine (NORVASC) 5 MG tablet Take 1 tablet (5 mg total) by mouth daily.  . Biotin w/ Vitamins C & E (HAIR/SKIN/NAILS PO) Take by mouth daily.  . enalapril (VASOTEC) 20 MG tablet Take 1 tablet (20 mg total) by mouth 2 (two) times daily.  . fexofenadine (ALLEGRA) 180 MG tablet Take 180 mg by mouth daily.  Marland Kitchen GLUCOSAMINE-CHONDROITIN DS PO Take by mouth daily.  . Multiple Vitamin (MULTIVITAMIN) tablet Take 1 tablet by mouth daily.    . [DISCONTINUED] amLODipine (NORVASC) 5 MG tablet Take 1 tablet (5 mg total) by mouth daily.  . [DISCONTINUED] enalapril (VASOTEC) 20 MG tablet Take 1 tablet (20 mg total) by mouth 2 (two) times daily.   No facility-administered encounter medications on file as of 11/08/2020.     Review of Systems  Constitutional: Negative for chills and fever.  Eyes: Negative for visual disturbance.  Respiratory: Negative for shortness of breath.   Cardiovascular: Negative for chest pain.  Gastrointestinal: Negative for abdominal pain.  Genitourinary:       Due for her Pap smear, done by GYN.  Neurological: Negative for headaches.      Vitals BP 124/82   Pulse 91   Temp 98.2 F (36.8 C) (Oral)   Ht 5' 5.5" (1.664 m)   Wt 168 lb 9.6 oz (76.5 kg)   LMP 12/11/2014 (Approximate)   SpO2 99%   BMI 27.63 kg/m   Objective:   Physical Exam Vitals and nursing note reviewed.  Constitutional:      General: She is not in acute distress.    Appearance: Normal appearance. She is not ill-appearing.  Cardiovascular:     Rate and Rhythm: Normal rate and regular rhythm.     Heart sounds: Normal heart sounds.  Pulmonary:     Effort: Pulmonary effort is normal.     Breath sounds: Normal breath sounds.  Skin:    General: Skin is warm and dry.  Neurological:     General: No focal deficit present.     Mental Status: She is alert and oriented to person, place, and time.  Psychiatric:        Mood and Affect: Mood normal.        Behavior: Behavior normal.        Thought Content: Thought content normal.        Judgment: Judgment normal.      Assessment and Plan   1. Essential hypertension - enalapril (VASOTEC) 20 MG tablet; Take 1 tablet (20 mg total) by mouth 2 (two) times daily.  Dispense: 180 tablet; Refill: 1 - amLODipine (NORVASC) 5 MG tablet; Take 1 tablet (5 mg total) by mouth daily.  Dispense: 90 tablet; Refill: 1 - CBC with Differential/Platelet - Lipid panel - Comprehensive metabolic panel  2. Screening cholesterol level - Lipid panel  3. Need for vaccination - Flu Vaccine QUAD 36+ mos IM    She will stop on her way out today and get the laboratory work done.  She is instructed to schedule her Pap smear with GYN as soon as possible.  She is struggling with lifestyle modification, as she is started a new job.  Much of today's visit was around exercise, healthy diet, with sodium restriction.  She is encouraged to take her blood pressure several times per month, to ensure adequate control.  She is compliant with her current medication regimen.  No chest pain no shortness of breath reported. Refills sent.    Agrees with plan of care discussed today. Understands warning signs to seek further care: Chest pain, shortness of breath, any significant changes in health status, blood pressure not being controlled. Understands to follow-up in 6 months for routine medication recheck, will notify her of laboratory results once become available.  Pecolia Ades, FNP-C

## 2020-11-09 LAB — CBC WITH DIFFERENTIAL/PLATELET
Basophils Absolute: 0 10*3/uL (ref 0.0–0.2)
Basos: 1 %
EOS (ABSOLUTE): 0.2 10*3/uL (ref 0.0–0.4)
Eos: 4 %
Hematocrit: 40.3 % (ref 34.0–46.6)
Hemoglobin: 13.3 g/dL (ref 11.1–15.9)
Immature Grans (Abs): 0 10*3/uL (ref 0.0–0.1)
Immature Granulocytes: 0 %
Lymphocytes Absolute: 1.6 10*3/uL (ref 0.7–3.1)
Lymphs: 39 %
MCH: 28.4 pg (ref 26.6–33.0)
MCHC: 33 g/dL (ref 31.5–35.7)
MCV: 86 fL (ref 79–97)
Monocytes Absolute: 0.4 10*3/uL (ref 0.1–0.9)
Monocytes: 9 %
Neutrophils Absolute: 1.9 10*3/uL (ref 1.4–7.0)
Neutrophils: 47 %
Platelets: 206 10*3/uL (ref 150–450)
RBC: 4.69 x10E6/uL (ref 3.77–5.28)
RDW: 12.3 % (ref 11.7–15.4)
WBC: 4 10*3/uL (ref 3.4–10.8)

## 2020-11-09 LAB — COMPREHENSIVE METABOLIC PANEL
ALT: 14 IU/L (ref 0–32)
AST: 28 IU/L (ref 0–40)
Albumin/Globulin Ratio: 2.1 (ref 1.2–2.2)
Albumin: 4.6 g/dL (ref 3.8–4.9)
Alkaline Phosphatase: 88 IU/L (ref 44–121)
BUN/Creatinine Ratio: 22 (ref 9–23)
BUN: 17 mg/dL (ref 6–24)
Bilirubin Total: 0.5 mg/dL (ref 0.0–1.2)
CO2: 25 mmol/L (ref 20–29)
Calcium: 9.6 mg/dL (ref 8.7–10.2)
Chloride: 103 mmol/L (ref 96–106)
Creatinine, Ser: 0.79 mg/dL (ref 0.57–1.00)
GFR calc Af Amer: 95 mL/min/{1.73_m2} (ref >59)
GFR calc non Af Amer: 83 mL/min/{1.73_m2} (ref >59)
Globulin, Total: 2.2 g/dL (ref 1.5–4.5)
Glucose: 100 mg/dL — ABNORMAL HIGH (ref 65–99)
Potassium: 4.9 mmol/L (ref 3.5–5.2)
Sodium: 142 mmol/L (ref 134–144)
Total Protein: 6.8 g/dL (ref 6.0–8.5)

## 2020-11-09 LAB — LIPID PANEL
Chol/HDL Ratio: 3.9 ratio (ref 0.0–4.4)
Cholesterol, Total: 191 mg/dL (ref 100–199)
HDL: 49 mg/dL (ref >39)
LDL Chol Calc (NIH): 119 mg/dL — ABNORMAL HIGH (ref 0–99)
Triglycerides: 129 mg/dL (ref 0–149)
VLDL Cholesterol Cal: 23 mg/dL (ref 5–40)

## 2020-11-30 ENCOUNTER — Encounter: Payer: Self-pay | Admitting: Adult Health

## 2020-12-27 ENCOUNTER — Ambulatory Visit (HOSPITAL_COMMUNITY): Payer: Managed Care, Other (non HMO)

## 2021-01-03 ENCOUNTER — Ambulatory Visit (HOSPITAL_COMMUNITY): Payer: Managed Care, Other (non HMO)

## 2021-01-10 ENCOUNTER — Encounter (HOSPITAL_COMMUNITY): Admission: RE | Admit: 2021-01-10 | Payer: Managed Care, Other (non HMO) | Source: Ambulatory Visit

## 2021-01-10 ENCOUNTER — Other Ambulatory Visit: Payer: Managed Care, Other (non HMO) | Admitting: Adult Health

## 2021-01-10 ENCOUNTER — Encounter (HOSPITAL_COMMUNITY): Payer: Self-pay

## 2021-01-17 ENCOUNTER — Other Ambulatory Visit: Payer: Managed Care, Other (non HMO) | Admitting: Adult Health

## 2021-01-24 ENCOUNTER — Ambulatory Visit (HOSPITAL_COMMUNITY)
Admission: RE | Admit: 2021-01-24 | Discharge: 2021-01-24 | Disposition: A | Discharge status: Home / Self Care | Payer: 59 | Source: Ambulatory Visit | Attending: Family Medicine | Admitting: Family Medicine

## 2021-01-24 ENCOUNTER — Other Ambulatory Visit: Payer: Self-pay

## 2021-01-24 DIAGNOSIS — Z1231 Encounter for screening mammogram for malignant neoplasm of breast: Secondary | ICD-10-CM | POA: Insufficient documentation

## 2021-01-31 ENCOUNTER — Encounter: Payer: Self-pay | Admitting: Adult Health

## 2021-01-31 ENCOUNTER — Other Ambulatory Visit (HOSPITAL_COMMUNITY)
Admission: RE | Admit: 2021-01-31 | Discharge: 2021-01-31 | Disposition: A | Discharge status: Home / Self Care | Payer: 59 | Source: Ambulatory Visit | Attending: Adult Health | Admitting: Adult Health

## 2021-01-31 ENCOUNTER — Ambulatory Visit (INDEPENDENT_AMBULATORY_CARE_PROVIDER_SITE_OTHER): Payer: 59 | Admitting: Adult Health

## 2021-01-31 ENCOUNTER — Other Ambulatory Visit: Payer: Self-pay

## 2021-01-31 VITALS — BP 108/66 | HR 60 | Ht 66.0 in | Wt 163.5 lb

## 2021-01-31 DIAGNOSIS — Z01419 Encounter for gynecological examination (general) (routine) without abnormal findings: Secondary | ICD-10-CM | POA: Diagnosis present

## 2021-01-31 DIAGNOSIS — Z1211 Encounter for screening for malignant neoplasm of colon: Secondary | ICD-10-CM | POA: Insufficient documentation

## 2021-01-31 DIAGNOSIS — K649 Unspecified hemorrhoids: Secondary | ICD-10-CM | POA: Insufficient documentation

## 2021-01-31 LAB — HEMOCCULT GUIAC POC 1CARD (OFFICE): Fecal Occult Blood, POC: NEGATIVE

## 2021-01-31 NOTE — Progress Notes (Signed)
Patient ID: Sarah Mason, female   DOB: Apr 08, 1961, 60 y.o.   MRN: [id number] History of Present Illness: Sarah Mason is a 60 year old white female, married, PM in for well woman gyn exam and pap. Son is married, daughter is Paramedic at [employer] , and her mom is here now, and she has gone back to work.  PCP is Dr Lovena Le.  Current Medications, Allergies, Past Medical History, Past Surgical History, Family History and Social History were reviewed in [employer] record.     Review of Systems:  Patient denies any headaches, hearing loss, fatigue, blurred vision, shortness of breath, chest pain, abdominal pain, problems with bowel movements, urination, or intercourse. No joint pain or mood swings. Denies any vaginal bleeding   Physical Exam:BP 108/66 (BP Location: Left Arm, Patient Position: Sitting, Cuff Size: Normal)   Pulse 60   Ht 5\' 6"  (1.676 m)   Wt 163 lb 8 oz (74.2 kg)   LMP 12/11/2014 (Approximate)   BMI 26.39 kg/m  General:  Well developed, well nourished, no acute distress Skin:  Warm and dry Pelvic:  External genitalia is normal in appearance, no lesions.  The vagina is normal in appearance. Urethra has no lesions or masses. The cervix is smooth, pap with HRHPV genotyping performed.  Uterus is felt to be normal size, shape, and contour.  No adnexal masses or tenderness noted.Bladder is non tender, no masses felt. Rectal: Good sphincter tone, no polyps, + hemorrhoids felt.  Hemoccult negative. Extremities/musculoskeletal:  No swelling or varicosities noted, no clubbing or cyanosis Psych:  No mood changes, alert and cooperative,seems happy AA is 3 Fall risk is low PHQ 9 score is 3 GAD 7 score is 0  Upstream - 01/31/21 1043      Pregnancy Intention Screening   Does the patient want to become pregnant in the next year? N/A    Does the patient's partner want to become pregnant in the next year? N/A    Would the patient like to discuss contraceptive options today? N/A       Contraception Wrap Up   Current Method Vasectomy    End Method Vasectomy    Contraception Counseling Provided No         Examination chaperoned by Levy Pupa LPN  Impression and Plan:  1. Encounter for gynecological examination with Papanicolaou smear of cervix Pap sent Pap in 3 years if negative Physical with PCP Labs with PCP Mammogram yearly Colonoscopy per GI   2. Encounter for screening fecal occult blood testing  3. Hemorrhoids, unspecified hemorrhoid type

## 2021-02-05 LAB — CYTOLOGY - PAP
Comment: NEGATIVE
Diagnosis: NEGATIVE
High risk HPV: NEGATIVE

## 2021-03-28 ENCOUNTER — Telehealth: Payer: Self-pay

## 2021-03-28 NOTE — Telephone Encounter (Signed)
Pt made a med check appt on 04/14 will pt need blood work?   Pt call back [phone]

## 2021-03-28 NOTE — Telephone Encounter (Signed)
Last labs 11/08/20 lipid, cmp, cbc

## 2021-04-01 ENCOUNTER — Other Ambulatory Visit: Payer: Self-pay | Admitting: *Deleted

## 2021-04-01 DIAGNOSIS — I10 Essential (primary) hypertension: Secondary | ICD-10-CM

## 2021-04-01 DIAGNOSIS — Z79899 Other long term (current) drug therapy: Secondary | ICD-10-CM

## 2021-04-01 DIAGNOSIS — Z13220 Encounter for screening for lipoid disorders: Secondary | ICD-10-CM

## 2021-04-01 NOTE — Telephone Encounter (Signed)
Yes pls order those. Thx. Dr. Darene Lamer

## 2021-04-01 NOTE — Telephone Encounter (Signed)
Bw orders put in and pt was notified.

## 2021-04-03 LAB — CBC WITH DIFFERENTIAL/PLATELET
Basophils Absolute: 0 10*3/uL (ref 0.0–0.2)
Basos: 1 %
EOS (ABSOLUTE): 0.1 10*3/uL (ref 0.0–0.4)
Eos: 3 %
Hematocrit: 45 % (ref 34.0–46.6)
Hemoglobin: 14.6 g/dL (ref 11.1–15.9)
Immature Grans (Abs): 0 10*3/uL (ref 0.0–0.1)
Immature Granulocytes: 0 %
Lymphocytes Absolute: 1.5 10*3/uL (ref 0.7–3.1)
Lymphs: 37 %
MCH: 28.2 pg (ref 26.6–33.0)
MCHC: 32.4 g/dL (ref 31.5–35.7)
MCV: 87 fL (ref 79–97)
Monocytes Absolute: 0.2 10*3/uL (ref 0.1–0.9)
Monocytes: 6 %
Neutrophils Absolute: 2.1 10*3/uL (ref 1.4–7.0)
Neutrophils: 53 %
Platelets: 183 10*3/uL (ref 150–450)
RBC: 5.18 x10E6/uL (ref 3.77–5.28)
RDW: 12.7 % (ref 11.7–15.4)
WBC: 4 10*3/uL (ref 3.4–10.8)

## 2021-04-03 LAB — LIPID PANEL
Chol/HDL Ratio: 3.9 ratio (ref 0.0–4.4)
Cholesterol, Total: 184 mg/dL (ref 100–199)
HDL: 47 mg/dL (ref >39)
LDL Chol Calc (NIH): 121 mg/dL — ABNORMAL HIGH (ref 0–99)
Triglycerides: 86 mg/dL (ref 0–149)
VLDL Cholesterol Cal: 16 mg/dL (ref 5–40)

## 2021-04-03 LAB — COMPREHENSIVE METABOLIC PANEL
ALT: 14 IU/L (ref 0–32)
AST: 25 IU/L (ref 0–40)
Albumin/Globulin Ratio: 2.2 (ref 1.2–2.2)
Albumin: 4.7 g/dL (ref 3.8–4.9)
Alkaline Phosphatase: 79 IU/L (ref 44–121)
BUN/Creatinine Ratio: 25 — ABNORMAL HIGH (ref 9–23)
BUN: 20 mg/dL (ref 6–24)
Bilirubin Total: 0.7 mg/dL (ref 0.0–1.2)
CO2: 24 mmol/L (ref 20–29)
Calcium: 9.3 mg/dL (ref 8.7–10.2)
Chloride: 102 mmol/L (ref 96–106)
Creatinine, Ser: 0.79 mg/dL (ref 0.57–1.00)
Globulin, Total: 2.1 g/dL (ref 1.5–4.5)
Glucose: 108 mg/dL — ABNORMAL HIGH (ref 65–99)
Potassium: 4.3 mmol/L (ref 3.5–5.2)
Sodium: 141 mmol/L (ref 134–144)
Total Protein: 6.8 g/dL (ref 6.0–8.5)
eGFR: 86 mL/min/{1.73_m2} (ref >59)

## 2021-04-04 ENCOUNTER — Ambulatory Visit (INDEPENDENT_AMBULATORY_CARE_PROVIDER_SITE_OTHER): Payer: 59 | Admitting: Family Medicine

## 2021-04-04 ENCOUNTER — Other Ambulatory Visit: Payer: Self-pay

## 2021-04-04 ENCOUNTER — Encounter: Payer: Self-pay | Admitting: Family Medicine

## 2021-04-04 DIAGNOSIS — I10 Essential (primary) hypertension: Secondary | ICD-10-CM

## 2021-04-04 MED ORDER — AMLODIPINE BESYLATE 5 MG PO TABS: 5.0000 mg | ORAL_TABLET | Freq: Every day | ORAL | 1 refills | Status: DC

## 2021-04-04 MED ORDER — ENALAPRIL MALEATE 20 MG PO TABS
20.0000 mg | ORAL_TABLET | Freq: Two times a day (BID) | ORAL | 1 refills | Status: DC
Start: 2021-04-04 — End: 2021-05-10

## 2021-04-04 NOTE — Progress Notes (Signed)
Patient ID: Sarah Mason, female    DOB: 02-Jul-1961, 60 y.o.   MRN: [id number]   Chief Complaint  Patient presents with  . Hypertension   Subjective:    HPI Pt here for HTN check. Pt has lost weight and thought that would help blood pressure come down. Checking blood pressure at home about once per week. Taking all meds as directed.   Lost about 13 lbs.  Has been decreasing the salty things. Has been 90 days since changing diet.  Exercising- 3x per week stretching and walking. Sedentary at a new job recently. Walking 2 miles per day. Cutting out cheese and chip/crackers. And less frequently.  Parents have htn, and dm2.  Knows genetically they had issues.    Medical History Sarah Mason has a past medical history of Abnormal Pap smear, Bilateral ovarian cysts (12/08/2014), Exercise-induced asthma, Fibroids, HSV (herpes simplex virus) infection, Hypertension, Plantar fibromatosis, Thickened endometrium (12/08/2014), and Vaginal Pap smear, abnormal.   Outpatient Encounter Medications as of 04/04/2021  Medication Sig  . Biotin w/ Vitamins C & E (HAIR/SKIN/NAILS PO) Take by mouth daily.  . Cetirizine HCl (ZYRTEC PO) Take by mouth.  . Cholecalciferol (VITAMIN D3 PO) Take by mouth.  . fexofenadine (ALLEGRA) 180 MG tablet Take 180 mg by mouth daily.  Marland Kitchen GLUCOSAMINE-CHONDROITIN DS PO Take by mouth daily.  . Multiple Vitamin (MULTIVITAMIN) tablet Take 1 tablet by mouth daily.  . [DISCONTINUED] amLODipine (NORVASC) 5 MG tablet Take 1 tablet (5 mg total) by mouth daily.  . [DISCONTINUED] enalapril (VASOTEC) 20 MG tablet Take 1 tablet (20 mg total) by mouth 2 (two) times daily.  Marland Kitchen amLODipine (NORVASC) 5 MG tablet Take 1 tablet (5 mg total) by mouth daily.  . enalapril (VASOTEC) 20 MG tablet Take 1 tablet (20 mg total) by mouth 2 (two) times daily.   No facility-administered encounter medications on file as of 04/04/2021.     Review of Systems  Constitutional: Negative for chills and fever.   HENT: Negative for congestion, rhinorrhea and sore throat.   Respiratory: Negative for cough, shortness of breath and wheezing.   Cardiovascular: Negative for chest pain and leg swelling.  Gastrointestinal: Negative for abdominal pain, diarrhea, nausea and vomiting.  Genitourinary: Negative for dysuria and frequency.  Musculoskeletal: Negative for arthralgias and back pain.  Skin: Negative for rash.  Neurological: Negative for dizziness, weakness and headaches.     Vitals BP 123/80   Pulse (!) 54   Temp (!) 96.6 F (35.9 C)   Ht 5\' 6"  (1.676 m)   Wt 156 lb 12.8 oz (71.1 kg)   LMP 12/11/2014 (Approximate)   SpO2 100%   BMI 25.31 kg/m   Objective:   Physical Exam Vitals and nursing note reviewed.  Constitutional:      General: She is not in acute distress.    Appearance: Normal appearance.  HENT:     Head: Normocephalic and atraumatic.  Cardiovascular:     Rate and Rhythm: Regular rhythm. Bradycardia present.     Pulses: Normal pulses.     Heart sounds: Normal heart sounds.  Pulmonary:     Effort: Pulmonary effort is normal.     Breath sounds: Normal breath sounds. No wheezing, rhonchi or rales.  Musculoskeletal:        General: Normal range of motion.     Right lower leg: No edema.     Left lower leg: No edema.  Skin:    General: Skin is warm and dry.  Findings: No lesion or rash.  Neurological:     General: No focal deficit present.     Mental Status: She is alert and oriented to person, place, and time.     Cranial Nerves: No cranial nerve deficit.  Psychiatric:        Mood and Affect: Mood normal.        Behavior: Behavior normal.        Thought Content: Thought content normal.        Judgment: Judgment normal.      Assessment and Plan   1. Essential hypertension - amLODipine (NORVASC) 5 MG tablet; Take 1 tablet (5 mg total) by mouth daily.  Dispense: 90 tablet; Refill: 1 - enalapril (VASOTEC) 20 MG tablet; Take 1 tablet (20 mg total) by mouth 2  (two) times daily.  Dispense: 180 tablet; Refill: 1   htn- stable. Cont meds.  Doing well with weight loss with diet and exercising.  Pt lost 13 lbs.  Will recheck labs and bp next visit.  Pt to cont to watch carbs and salt in the diet.  Return in about 6 months (around 10/04/2021) for f/u htn.

## 2021-05-10 ENCOUNTER — Telehealth: Payer: Self-pay | Admitting: Family Medicine

## 2021-05-10 DIAGNOSIS — I10 Essential (primary) hypertension: Secondary | ICD-10-CM

## 2021-05-10 MED ORDER — AMLODIPINE BESYLATE 5 MG PO TABS: 5.0000 mg | ORAL_TABLET | Freq: Every day | ORAL | 1 refills | Status: DC

## 2021-05-10 MED ORDER — ENALAPRIL MALEATE 20 MG PO TABS: 20.0000 mg | ORAL_TABLET | Freq: Two times a day (BID) | ORAL | 1 refills | Status: DC

## 2021-05-10 NOTE — Telephone Encounter (Signed)
Medications sent to [facility] and pt is aware

## 2021-05-10 NOTE — Telephone Encounter (Signed)
Please advise. Thank you

## 2021-05-10 NOTE — Telephone Encounter (Signed)
Patient is requesting 90 day supply on amlodipine 20 mg and enalapril 5 mg called into [facility]

## 2021-08-13 ENCOUNTER — Ambulatory Visit (INDEPENDENT_AMBULATORY_CARE_PROVIDER_SITE_OTHER): Payer: 59 | Admitting: Internal Medicine

## 2021-11-07 ENCOUNTER — Other Ambulatory Visit: Payer: Self-pay | Admitting: Family Medicine

## 2021-11-07 DIAGNOSIS — I10 Essential (primary) hypertension: Secondary | ICD-10-CM

## 2021-11-12 ENCOUNTER — Encounter: Payer: Self-pay | Admitting: Registered Nurse

## 2021-11-12 ENCOUNTER — Other Ambulatory Visit: Payer: Self-pay

## 2021-11-12 ENCOUNTER — Ambulatory Visit (INDEPENDENT_AMBULATORY_CARE_PROVIDER_SITE_OTHER): Payer: 59 | Admitting: Registered Nurse

## 2021-11-12 ENCOUNTER — Ambulatory Visit: Payer: 59 | Admitting: Registered Nurse

## 2021-11-12 VITALS — BP 120/74 | HR 70 | Temp 97.8°F | Resp 17 | Ht 66.0 in | Wt 159.8 lb

## 2021-11-12 DIAGNOSIS — Z130 Encounter for screening for diseases of the blood and blood-forming organs and certain disorders involving the immune mechanism: Secondary | ICD-10-CM | POA: Diagnosis not present

## 2021-11-12 DIAGNOSIS — Z1329 Encounter for screening for other suspected endocrine disorder: Secondary | ICD-10-CM

## 2021-11-12 DIAGNOSIS — Z13228 Encounter for screening for other metabolic disorders: Secondary | ICD-10-CM | POA: Diagnosis not present

## 2021-11-12 DIAGNOSIS — I10 Essential (primary) hypertension: Secondary | ICD-10-CM

## 2021-11-12 DIAGNOSIS — Z0000 Encounter for general adult medical examination without abnormal findings: Secondary | ICD-10-CM

## 2021-11-12 DIAGNOSIS — Z1159 Encounter for screening for other viral diseases: Secondary | ICD-10-CM | POA: Diagnosis not present

## 2021-11-12 DIAGNOSIS — Z13220 Encounter for screening for lipoid disorders: Secondary | ICD-10-CM | POA: Diagnosis not present

## 2021-11-12 MED ORDER — AMLODIPINE BESYLATE 5 MG PO TABS: 5.0000 mg | ORAL_TABLET | Freq: Every day | ORAL | 1 refills | Status: DC

## 2021-11-12 MED ORDER — ENALAPRIL MALEATE 20 MG PO TABS: 20.0000 mg | ORAL_TABLET | Freq: Two times a day (BID) | ORAL | 1 refills | Status: DC

## 2021-11-12 NOTE — Progress Notes (Signed)
New Patient Office Visit  Subjective:  Patient ID: Sarah Mason, female    DOB: 1961/06/29  Age: 60 y.o. MRN: [id number]  CC:  Chief Complaint  Patient presents with   New Patient (Initial Visit)    Patient states she is here to establish care. Patient need medication refills and want her left eye looked at     HPI Sarah Mason presents for visit to est care.  Concern for L eye Swelling, itching, irritation No drainage No pain in orbit No injury or trauma.  Histories reviewed and updated with patient.    Hypertension: Patient Currently taking: amlodipine 5mg  po qd, enalapril 20mg  po qd. Good effect. No AEs. Denies CV symptoms including: chest pain, shob, doe, headache, visual changes, fatigue, claudication, and dependent edema.   Previous readings and labs: BP Readings from Last 3 Encounters:  11/12/21 120/74  04/04/21 123/80  01/31/21 108/66   Lab Results  Component Value Date   CREATININE 0.77 11/12/2021     Past Medical History:  Diagnosis Date   Abnormal Pap smear    Bilateral ovarian cysts 12/08/2014   Exercise-induced asthma    Fibroids    HSV (herpes simplex virus) infection    Hypertension    Plantar fibromatosis    Thickened endometrium 12/08/2014   Vaginal Pap smear, abnormal     Past Surgical History:  Procedure Laterality Date   CARPAL TUNNEL RELEASE Left    COLONOSCOPY N/A 11/14/2013   Procedure: COLONOSCOPY;  Surgeon: Danie Binder, MD;  [facility];  Service: Endoscopy;  Laterality: N/A;  10:30 AM   Dilatation and curettage     LASER ABLATION OF THE CERVIX     ORIF WRIST FRACTURE Left 01/02/2015   Procedure: OPEN REDUCTION INTERNAL FIXATION LEFT WRIST FRACTURE;  Surgeon: Roseanne Kaufman, MD;  Location: [place];  Service: Orthopedics;  Laterality: Left;    Family History  Problem Relation Age of Onset   Diabetes Father    Hypertension Father    Hyperlipidemia Father    Heart attack Father        In his 60's   Lung disease  Father        copd   Diabetes Mother    Hypertension Mother    Hyperlipidemia Mother    Other Mother        a fib   Arthritis Paternal Grandfather    Cancer Paternal Grandfather        lung   Arthritis Paternal Grandmother    Stroke Paternal Grandmother    Heart disease Maternal Grandmother    Arthritis Maternal Grandmother    Arthritis Maternal Grandfather    Lung disease Maternal Grandfather     Social History   Socioeconomic History   Marital status: Married    Spouse name: Not on file   Number of children: Not on file   Years of education: college 4y   Highest education level: Not on file  Occupational History   Occupation: Scientist, research (physical sciences): UNEMPLOYED  Tobacco Use   Smoking status: Former    Types: Cigarettes   Smokeless tobacco: Never   Tobacco comments:    Only in highschool  Vaping Use   Vaping Use: Never used  Substance and Sexual Activity   Alcohol use: Yes    Comment: One to two drinks per week   Drug use: No   Sexual activity: Yes    Birth control/protection: Surgical    Comment: vasectomy  Other Topics  Concern   Not on file  Social History Narrative   Not on file   Social Determinants of Health   Financial Resource Strain: Low Risk    Difficulty of Paying Living Expenses: Not hard at all  Food Insecurity: No Food Insecurity   Worried About Charity fundraiser in the Last Year: Never true   [place] in the Last Year: Never true  Transportation Needs: No Transportation Needs   Lack of Transportation (Medical): No   Lack of Transportation (Non-Medical): No  Physical Activity: Sufficiently Active   Days of Exercise per Week: 3 days   Minutes of Exercise per Session: 60 min  Stress: No Stress Concern Present   Feeling of Stress : Not at all  Social Connections: Moderately Integrated   Frequency of Communication with Friends and Family: Three times a week   Frequency of Social Gatherings with Friends and Family: Three times a week    Attends Religious Services: Never   Active Member of Clubs or Organizations: No   Attends Music therapist: 1 to 4 times per year   Marital Status: Married  Human resources officer Violence: Not At Risk   Fear of Current or Ex-Partner: No   Emotionally Abused: No   Physically Abused: No   Sexually Abused: No    ROS Review of Systems  Constitutional: Negative.   HENT: Negative.    Eyes: Negative.   Respiratory: Negative.    Cardiovascular: Negative.   Gastrointestinal: Negative.   Genitourinary: Negative.   Musculoskeletal: Negative.   Skin: Negative.   Neurological: Negative.   Psychiatric/Behavioral: Negative.    All other systems reviewed and are negative.  Objective:   Today's Vitals: BP 120/74   Pulse 70   Temp 97.8 F (36.6 C) (Temporal)   Resp 17   Ht 5\' 6"  (1.676 m)   Wt 159 lb 12.8 oz (72.5 kg)   LMP 12/11/2014 (Approximate)   SpO2 99%   BMI 25.79 kg/m   Physical Exam Vitals and nursing note reviewed.  Constitutional:      General: She is not in acute distress.    Appearance: Normal appearance. She is normal weight. She is not ill-appearing, toxic-appearing or diaphoretic.  HENT:     Head: Normocephalic and atraumatic.     Right Ear: Tympanic membrane, ear canal and external ear normal. There is no impacted cerumen.     Left Ear: Tympanic membrane, ear canal and external ear normal. There is no impacted cerumen.     Nose: Nose normal. No congestion or rhinorrhea.     Mouth/Throat:     Mouth: Mucous membranes are moist.     Pharynx: Oropharynx is clear. No oropharyngeal exudate or posterior oropharyngeal erythema.  Eyes:     General: No scleral icterus.       Right eye: No discharge.        Left eye: No discharge.     Extraocular Movements: Extraocular movements intact.     Conjunctiva/sclera: Conjunctivae normal.     Pupils: Pupils are equal, round, and reactive to light.  Neck:     Vascular: No carotid bruit.  Cardiovascular:     Rate  and Rhythm: Normal rate and regular rhythm.     Pulses: Normal pulses.     Heart sounds: Normal heart sounds. No murmur heard.   No friction rub. No gallop.  Pulmonary:     Effort: Pulmonary effort is normal. No respiratory distress.     Breath  sounds: Normal breath sounds. No stridor. No wheezing, rhonchi or rales.  Chest:     Chest wall: No tenderness.  Abdominal:     General: Abdomen is flat. Bowel sounds are normal. There is no distension.     Palpations: There is no mass.     Tenderness: There is no abdominal tenderness. There is no right CVA tenderness, left CVA tenderness, guarding or rebound.     Hernia: No hernia is present.  Musculoskeletal:        General: No swelling, tenderness, deformity or signs of injury. Normal range of motion.     Cervical back: Normal range of motion and neck supple. No rigidity or tenderness.     Right lower leg: No edema.     Left lower leg: No edema.  Lymphadenopathy:     Cervical: No cervical adenopathy.  Skin:    General: Skin is warm and dry.     Capillary Refill: Capillary refill takes less than 2 seconds.     Coloration: Skin is not jaundiced or pale.     Findings: No bruising, erythema, lesion or rash.  Neurological:     General: No focal deficit present.     Mental Status: She is alert and oriented to person, place, and time. Mental status is at baseline.     Cranial Nerves: No cranial nerve deficit.     Sensory: No sensory deficit.     Motor: No weakness.     Coordination: Coordination normal.     Gait: Gait normal.     Deep Tendon Reflexes: Reflexes normal.  Psychiatric:        Mood and Affect: Mood normal.        Behavior: Behavior normal.        Thought Content: Thought content normal.        Judgment: Judgment normal.    Assessment & Plan:   Problem List Items Addressed This Visit   None   Outpatient Encounter Medications as of 11/12/2021  Medication Sig   amLODipine (NORVASC) 5 MG tablet Take 1 tablet (5 mg total)  by mouth daily.   Biotin w/ Vitamins C & E (HAIR/SKIN/NAILS PO) Take by mouth daily.   Cetirizine HCl (ZYRTEC PO) Take by mouth.   Cholecalciferol (VITAMIN D3 PO) Take by mouth.   enalapril (VASOTEC) 20 MG tablet Take 1 tablet (20 mg total) by mouth 2 (two) times daily.   fexofenadine (ALLEGRA) 180 MG tablet Take 180 mg by mouth daily.   GLUCOSAMINE-CHONDROITIN DS PO Take by mouth daily.   Multiple Vitamin (MULTIVITAMIN) tablet Take 1 tablet by mouth daily.   No facility-administered encounter medications on file as of 11/12/2021.    Follow-up: No follow-ups on file.   PLAN Exam unremarkable Labs as above Refill medication x 6 mo Suggest warm compresses and OTC artificial tears for eye. Return if worsening Patient encouraged to call clinic with any questions, comments, or concerns.  Maximiano Coss, NP

## 2021-11-12 NOTE — Patient Instructions (Addendum)
Ms. Clydie Dillen to meet you -   No concerns on exam  Let's see what labs show  Meds refilled x 6 mo  See you then, sooner if you need anything  Thanks,  Rich     If you have lab work done today you will be contacted with your lab results within the next 2 weeks.  If you have not heard from [place] then please contact us. The fastest way to get your results is to register for My Chart.   IF you received an x-ray today, you will receive an invoice from [facility]. Please contact [facility] at [phone] with questions or concerns regarding your invoice.   IF you received labwork today, you will receive an invoice from [facility]. Please contact [facility] at [phone] with questions or concerns regarding your invoice.   Our billing staff will not be able to assist you with questions regarding bills from these companies.  You will be contacted with the lab results as soon as they are available. The fastest way to get your results is to activate your My Chart account. Instructions are located on the last page of this paperwork. If you have not heard from [place] regarding the results in 2 weeks, please contact this office.

## 2021-11-13 LAB — LIPID PANEL
Cholesterol: 200 mg/dL (ref 0–200)
HDL: 52.9 mg/dL (ref >39.00)
LDL Cholesterol: 119 mg/dL — ABNORMAL HIGH (ref 0–99)
NonHDL: 147.3
Total CHOL/HDL Ratio: 4
Triglycerides: 143 mg/dL (ref 0.0–149.0)
VLDL: 28.6 mg/dL (ref 0.0–40.0)

## 2021-11-13 LAB — COMPREHENSIVE METABOLIC PANEL
ALT: 13 U/L (ref 0–35)
AST: 25 U/L (ref 0–37)
Albumin: 4.7 g/dL (ref 3.5–5.2)
Alkaline Phosphatase: 77 U/L (ref 39–117)
BUN: 21 mg/dL (ref 6–23)
CO2: 29 mEq/L (ref 19–32)
Calcium: 9.2 mg/dL (ref 8.4–10.5)
Chloride: 103 mEq/L (ref 96–112)
Creatinine, Ser: 0.77 mg/dL (ref 0.40–1.20)
GFR: 84.3 mL/min (ref >60.00)
Glucose, Bld: 100 mg/dL — ABNORMAL HIGH (ref 70–99)
Potassium: 3.7 mEq/L (ref 3.5–5.1)
Sodium: 140 mEq/L (ref 135–145)
Total Bilirubin: 0.4 mg/dL (ref 0.2–1.2)
Total Protein: 7.1 g/dL (ref 6.0–8.3)

## 2021-11-13 LAB — CBC WITH DIFFERENTIAL/PLATELET
Basophils Absolute: 0 10*3/uL (ref 0.0–0.1)
Basophils Relative: 1 % (ref 0.0–3.0)
Eosinophils Absolute: 0.1 10*3/uL (ref 0.0–0.7)
Eosinophils Relative: 3.1 % (ref 0.0–5.0)
HCT: 44.9 % (ref 36.0–46.0)
Hemoglobin: 14.9 g/dL (ref 12.0–15.0)
Lymphocytes Relative: 41.3 % (ref 12.0–46.0)
Lymphs Abs: 1.9 10*3/uL (ref 0.7–4.0)
MCHC: 33.3 g/dL (ref 30.0–36.0)
MCV: 85.8 fl (ref 78.0–100.0)
Monocytes Absolute: 0.4 10*3/uL (ref 0.1–1.0)
Monocytes Relative: 8.4 % (ref 3.0–12.0)
Neutro Abs: 2.1 10*3/uL (ref 1.4–7.7)
Neutrophils Relative %: 46.2 % (ref 43.0–77.0)
Platelets: 189 10*3/uL (ref 150.0–400.0)
RBC: 5.23 Mil/uL — ABNORMAL HIGH (ref 3.87–5.11)
RDW: 13.1 % (ref 11.5–15.5)
WBC: 4.5 10*3/uL (ref 4.0–10.5)

## 2021-11-13 LAB — TSH: TSH: 1.53 u[IU]/mL (ref 0.35–5.50)

## 2021-11-13 LAB — HEMOGLOBIN A1C: Hgb A1c MFr Bld: 5.3 % (ref 4.6–6.5)

## 2021-11-16 LAB — HCV RNA,QUANTITATIVE REAL TIME PCR
HCV Quantitative Log: 2.36 Log IU/mL — ABNORMAL HIGH
HCV RNA, PCR, QN: 229 IU/mL — ABNORMAL HIGH

## 2021-11-16 LAB — HEPATITIS C ANTIBODY
Hepatitis C Ab: REACTIVE — AB
SIGNAL TO CUT-OFF: 2.12 — ABNORMAL HIGH (ref <1.00)

## 2021-11-18 ENCOUNTER — Other Ambulatory Visit: Payer: Self-pay | Admitting: Registered Nurse

## 2021-11-18 DIAGNOSIS — B1710 Acute hepatitis C without hepatic coma: Secondary | ICD-10-CM

## 2021-11-19 ENCOUNTER — Encounter: Payer: Self-pay | Admitting: Internal Medicine

## 2021-11-21 ENCOUNTER — Encounter: Payer: Self-pay | Admitting: Registered Nurse

## 2021-11-27 ENCOUNTER — Other Ambulatory Visit: Payer: Self-pay | Admitting: Registered Nurse

## 2021-11-27 DIAGNOSIS — R768 Other specified abnormal immunological findings in serum: Secondary | ICD-10-CM

## 2021-11-27 NOTE — Progress Notes (Signed)
Repeat Hep C testing given positive ab

## 2021-12-11 ENCOUNTER — Ambulatory Visit (INDEPENDENT_AMBULATORY_CARE_PROVIDER_SITE_OTHER): Payer: 59 | Admitting: Internal Medicine

## 2021-12-11 ENCOUNTER — Other Ambulatory Visit: Payer: Self-pay

## 2021-12-11 ENCOUNTER — Encounter: Payer: Self-pay | Admitting: Internal Medicine

## 2021-12-11 VITALS — BP 138/90 | HR 60 | Temp 97.1°F | Ht 67.0 in | Wt 160.6 lb

## 2021-12-11 DIAGNOSIS — R768 Other specified abnormal immunological findings in serum: Secondary | ICD-10-CM | POA: Diagnosis not present

## 2021-12-11 DIAGNOSIS — Z1211 Encounter for screening for malignant neoplasm of colon: Secondary | ICD-10-CM

## 2021-12-11 NOTE — Progress Notes (Signed)
Primary Care Physician:  Maximiano Coss, NP Primary Gastroenterologist:  Dr. Abbey Chatters  Chief Complaint  Patient presents with   Hepatitis C    Going to be retested next week with blood work    HPI:   Sarah Mason is a 60 y.o. female who presents to clinic today by referral from her PCP Maximiano Coss for evaluation.  She recently underwent routine blood work for physical and was found to be hepatitis C antibody positive.  Subsequent viral load 229.  These results came as a surprise to patient.  No personal or family history of hepatitis C.  No previous or current illicit drug use.  No exposures that she is aware of.  No transfusions previously.  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  No history of liver disease.  Most recent LFTs on 11/12/2021 WNL.  Last colonoscopy 2014 with 2 small hyperplastic polyps removed from her rectum.  No family history of colorectal malignancy.  No unintentional weight loss.  No melena hematochezia.  Past Medical History:  Diagnosis Date   Abnormal Pap smear    Bilateral ovarian cysts 12/08/2014   Exercise-induced asthma    worse in cold weather   Fibroids    HSV (herpes simplex virus) infection    Hypertension    Plantar fibromatosis    resolved   Thickened endometrium 12/08/2014    Past Surgical History:  Procedure Laterality Date   CARPAL TUNNEL RELEASE Left    COLONOSCOPY N/A 11/14/2013   Procedure: COLONOSCOPY;  Surgeon: Danie Binder, MD;  [facility];  Service: Endoscopy;  Laterality: N/A;  10:30 AM   Dilatation and curettage     LASER ABLATION OF THE CERVIX     ORIF WRIST FRACTURE Left 01/02/2015   Procedure: OPEN REDUCTION INTERNAL FIXATION LEFT WRIST FRACTURE;  Surgeon: Roseanne Kaufman, MD;  Location: [place];  Service: Orthopedics;  Laterality: Left;    Current Outpatient Medications  Medication Sig Dispense Refill   amLODipine (NORVASC) 5 MG tablet Take 1 tablet (5 mg  total) by mouth daily. 90 tablet 1   Biotin w/ Vitamins C & E (HAIR/SKIN/NAILS PO) Take by mouth daily.     Cetirizine HCl (ZYRTEC PO) Take by mouth.     enalapril (VASOTEC) 20 MG tablet Take 1 tablet (20 mg total) by mouth 2 (two) times daily. 180 tablet 1   fexofenadine (ALLEGRA) 180 MG tablet Take 180 mg by mouth daily.     GLUCOSAMINE-CHONDROITIN DS PO Take by mouth daily.     Multiple Vitamin (MULTIVITAMIN) tablet Take 1 tablet by mouth daily.     No current facility-administered medications for this visit.    Allergies as of 12/11/2021 - Review Complete 12/11/2021  Allergen Reaction Noted   Doxycycline Hives and Rash 10/02/2011    Family History  Problem Relation Age of Onset   Diabetes Mother    Hypertension Mother    Hyperlipidemia Mother    Atrial fibrillation Mother    Diabetes Father    Hypertension Father    Hyperlipidemia Father    Heart attack Father        In his 92's   Lung disease Father        copd   Heart disease Maternal Grandmother    Arthritis Maternal Grandmother    Arthritis Maternal Grandfather    Lung disease Maternal Grandfather    Arthritis Paternal Grandmother    Stroke Paternal Grandmother  Arthritis Paternal Grandfather    Lung cancer Paternal Grandfather        worked in [place] History   Socioeconomic History   Marital status: Married    Spouse name: Not on file   Number of children: Not on file   Years of education: college 4y   Highest education level: Not on file  Occupational History   Occupation: Scientist, research (physical sciences): UNEMPLOYED  Tobacco Use   Smoking status: Former    Types: Cigarettes   Smokeless tobacco: Never   Tobacco comments:    Only in highschool  Vaping Use   Vaping Use: Never used  Substance and Sexual Activity   Alcohol use: Yes    Comment: One to two drinks per week   Drug use: No   Sexual activity: Yes    Birth control/protection: Surgical    Comment: vasectomy  Other Topics Concern   Not  on file  Social History Narrative   Not on file   Social Determinants of Health   Financial Resource Strain: Low Risk    Difficulty of Paying Living Expenses: Not hard at all  Food Insecurity: No Food Insecurity   Worried About Charity fundraiser in the Last Year: Never true   [place] in the Last Year: Never true  Transportation Needs: No Transportation Needs   Lack of Transportation (Medical): No   Lack of Transportation (Non-Medical): No  Physical Activity: Sufficiently Active   Days of Exercise per Week: 3 days   Minutes of Exercise per Session: 60 min  Stress: No Stress Concern Present   Feeling of Stress : Not at all  Social Connections: Moderately Integrated   Frequency of Communication with Friends and Family: Three times a week   Frequency of Social Gatherings with Friends and Family: Three times a week   Attends Religious Services: Never   Active Member of Clubs or Organizations: No   Attends Music therapist: 1 to 4 times per year   Marital Status: Married  Human resources officer Violence: Not At Risk   Fear of Current or Ex-Partner: No   Emotionally Abused: No   Physically Abused: No   Sexually Abused: No    Subjective: Review of Systems  Constitutional:  Negative for chills and fever.  HENT:  Negative for congestion and hearing loss.   Eyes:  Negative for blurred vision and double vision.  Respiratory:  Negative for cough and shortness of breath.   Cardiovascular:  Negative for chest pain and palpitations.  Gastrointestinal:  Negative for abdominal pain, blood in stool, constipation, diarrhea, heartburn, melena and vomiting.  Genitourinary:  Negative for dysuria and urgency.  Musculoskeletal:  Negative for joint pain and myalgias.  Skin:  Negative for itching and rash.  Neurological:  Negative for dizziness and headaches.  Psychiatric/Behavioral:  Negative for depression. The patient is not nervous/anxious.       Objective: BP 138/90     Pulse 60    Temp (!) 97.1 F (36.2 C)    Ht 5\' 7"  (1.702 m)    Wt 160 lb 9.6 oz (72.8 kg)    LMP 12/11/2014 (Approximate)    BMI 25.15 kg/m  Physical Exam Constitutional:      Appearance: Normal appearance.  HENT:     Head: Normocephalic and atraumatic.  Eyes:     Extraocular Movements: Extraocular movements intact.     Conjunctiva/sclera: Conjunctivae normal.  Cardiovascular:     Rate  and Rhythm: Normal rate and regular rhythm.  Pulmonary:     Effort: Pulmonary effort is normal.     Breath sounds: Normal breath sounds.  Abdominal:     General: Bowel sounds are normal.     Palpations: Abdomen is soft.  Musculoskeletal:        General: No swelling. Normal range of motion.     Cervical back: Normal range of motion and neck supple.  Skin:    General: Skin is warm and dry.     Coloration: Skin is not jaundiced.  Neurological:     General: No focal deficit present.     Mental Status: She is alert and oriented to person, place, and time.  Psychiatric:        Mood and Affect: Mood normal.        Behavior: Behavior normal.     Assessment: *Hepatitis C antibody positive *Colon cancer screening  Plan: Discussed hepatitis C in depth with patient today including potential treatment measures.  Her viral load is low at 229 though still positive so warrants treatment.  She has no risk factors for hepatitis C based on her history.  We will recheck her viral load today as well as genotype, viral hepatitis B testing, HIV testing, liver function test.  Pending these results, we will likely pursue ultrasound with elastography in anticipation of treatment of hepatitis C.  Colonoscopy recall 2024 for colon cancer screening purposes.  Follow-up in 4 months or sooner if needed.  Thank you Maximiano Coss for the kind referral.  12/11/2021 1:26 PM   Disclaimer: This note was dictated with voice recognition software. Similar sounding words can inadvertently be transcribed and may not be  corrected upon review.

## 2021-12-11 NOTE — Patient Instructions (Addendum)
I am going to recheck your hepatitis C viral load today as well as screen you for hepatitis B and HIV.  Blood work to be performed at [facility].  If you still have evidence of active hepatitis C, we will need to pursue ultrasound with elastography to further evaluate your liver.  Once I have this back, we can pursue treatment for chronic hepatitis C.  We will plan on colonoscopy for colon cancer screening purposes in 2024.  Follow-up with GI in 4 months.  It was very nice meeting you today.  I hope you have a Merry Christmas.  Dr. Abbey Chatters  At [facility] we value your feedback. You may receive a survey about your visit today. Please share your experience as we strive to create trusting relationships with our patients to provide genuine, compassionate, quality care.  We appreciate your understanding and patience as we review any laboratory studies, imaging, and other diagnostic tests that are ordered as we care for you. Our office policy is 5 business days for review of these results, and any emergent or urgent results are addressed in a timely manner for your best interest. If you do not hear from our office in 1 week, please contact us.   We also encourage the use of MyChart, which contains your medical information for your review as well. If you are not enrolled in this feature, an access code is on this after visit summary for your convenience. Thank you for allowing [place] to be involved in your care.  It was great to see you today!  I hope you have a great rest of your Winter!    Elon Alas. Abbey Chatters, D.O. Gastroenterology and Hepatology [facility]

## 2021-12-17 ENCOUNTER — Other Ambulatory Visit: Payer: 59

## 2021-12-18 ENCOUNTER — Telehealth: Payer: Self-pay | Admitting: Internal Medicine

## 2021-12-18 DIAGNOSIS — R768 Other specified abnormal immunological findings in serum: Secondary | ICD-10-CM

## 2021-12-18 LAB — HEPATITIS B CORE ANTIBODY, TOTAL: Hep B Core Total Ab: NONREACTIVE

## 2021-12-18 LAB — HEPATIC FUNCTION PANEL
AG Ratio: 2 (calc) (ref 1.0–2.5)
ALT: 12 U/L (ref 6–29)
AST: 24 U/L (ref 10–35)
Albumin: 4.7 g/dL (ref 3.6–5.1)
Alkaline phosphatase (APISO): 73 U/L (ref 37–153)
Bilirubin, Direct: 0.1 mg/dL (ref 0.0–0.2)
Globulin: 2.4 g/dL (calc) (ref 1.9–3.7)
Indirect Bilirubin: 0.4 mg/dL (calc) (ref 0.2–1.2)
Total Bilirubin: 0.5 mg/dL (ref 0.2–1.2)
Total Protein: 7.1 g/dL (ref 6.1–8.1)

## 2021-12-18 LAB — HIV ANTIBODY (ROUTINE TESTING W REFLEX): HIV 1&2 Ab, 4th Generation: NONREACTIVE

## 2021-12-18 LAB — HEPATITIS C GENOTYPE: HCV Genotype: NOT DETECTED

## 2021-12-18 LAB — HEPATITIS C RNA QUANTITATIVE
HCV Quantitative Log: <1.18 log IU/mL
HCV RNA, PCR, QN: <15 IU/mL

## 2021-12-18 LAB — HEPATITIS B SURFACE ANTIBODY,QUALITATIVE: Hep B S Ab: REACTIVE — AB

## 2021-12-18 LAB — HEPATITIS B SURFACE ANTIGEN: Hepatitis B Surface Ag: NONREACTIVE

## 2021-12-18 NOTE — Telephone Encounter (Signed)
Noted will print labs and mail to the pt with instructions

## 2021-12-18 NOTE — Telephone Encounter (Signed)
I attempted to call patient in regards to her lab results.  Her hepatitis C viral load is undetected at present.  For now we will continue to monitor.  Recommend repeat hepatitis C viral load in 3 to 4 months at [facility] labs which I will order today.  I did leave her a voicemail with instructions in regards to this.  Sending to you as an FYI in case she calls back.

## 2022-02-18 ENCOUNTER — Encounter: Payer: Self-pay | Admitting: Registered Nurse

## 2022-02-19 NOTE — Telephone Encounter (Signed)
Pt calling back to follow up on referral. ?

## 2022-02-20 ENCOUNTER — Other Ambulatory Visit: Payer: Self-pay | Admitting: Registered Nurse

## 2022-02-20 DIAGNOSIS — H539 Unspecified visual disturbance: Secondary | ICD-10-CM

## 2022-02-25 ENCOUNTER — Other Ambulatory Visit: Payer: Self-pay

## 2022-02-25 ENCOUNTER — Encounter (INDEPENDENT_AMBULATORY_CARE_PROVIDER_SITE_OTHER): Payer: 59 | Admitting: Ophthalmology

## 2022-02-25 ENCOUNTER — Other Ambulatory Visit (HOSPITAL_COMMUNITY): Payer: Self-pay | Admitting: Obstetrics & Gynecology

## 2022-02-25 DIAGNOSIS — H33301 Unspecified retinal break, right eye: Secondary | ICD-10-CM

## 2022-02-25 DIAGNOSIS — H35033 Hypertensive retinopathy, bilateral: Secondary | ICD-10-CM

## 2022-02-25 DIAGNOSIS — H43813 Vitreous degeneration, bilateral: Secondary | ICD-10-CM

## 2022-02-25 DIAGNOSIS — I10 Essential (primary) hypertension: Secondary | ICD-10-CM | POA: Diagnosis not present

## 2022-02-25 DIAGNOSIS — Z1231 Encounter for screening mammogram for malignant neoplasm of breast: Secondary | ICD-10-CM

## 2022-03-06 ENCOUNTER — Ambulatory Visit (HOSPITAL_COMMUNITY)
Admission: RE | Admit: 2022-03-06 | Discharge: 2022-03-06 | Disposition: A | Discharge status: Home / Self Care | Payer: 59 | Source: Ambulatory Visit | Attending: Obstetrics & Gynecology | Admitting: Obstetrics & Gynecology

## 2022-03-06 ENCOUNTER — Other Ambulatory Visit: Payer: Self-pay

## 2022-03-06 DIAGNOSIS — Z1231 Encounter for screening mammogram for malignant neoplasm of breast: Secondary | ICD-10-CM | POA: Diagnosis present

## 2022-03-10 ENCOUNTER — Other Ambulatory Visit: Payer: Self-pay

## 2022-03-10 ENCOUNTER — Encounter (INDEPENDENT_AMBULATORY_CARE_PROVIDER_SITE_OTHER): Payer: Managed Care, Other (non HMO) | Admitting: Ophthalmology

## 2022-03-10 DIAGNOSIS — H33302 Unspecified retinal break, left eye: Secondary | ICD-10-CM

## 2022-03-24 ENCOUNTER — Ambulatory Visit: Payer: 59 | Admitting: Gastroenterology

## 2022-03-31 IMAGING — MG MM DIGITAL SCREENING BILAT W/ TOMO AND CAD
8 series · 8 of 24 positions shown · non-contrast
Comparison: Previous exam(s).

ACR Breast Density Category a: The breast tissue is almost entirely
fatty.

CLINICAL DATA: Screening.

EXAM:
DIGITAL SCREENING BILATERAL MAMMOGRAM WITH TOMOSYNTHESIS AND CAD

[L MLO synth-2D]
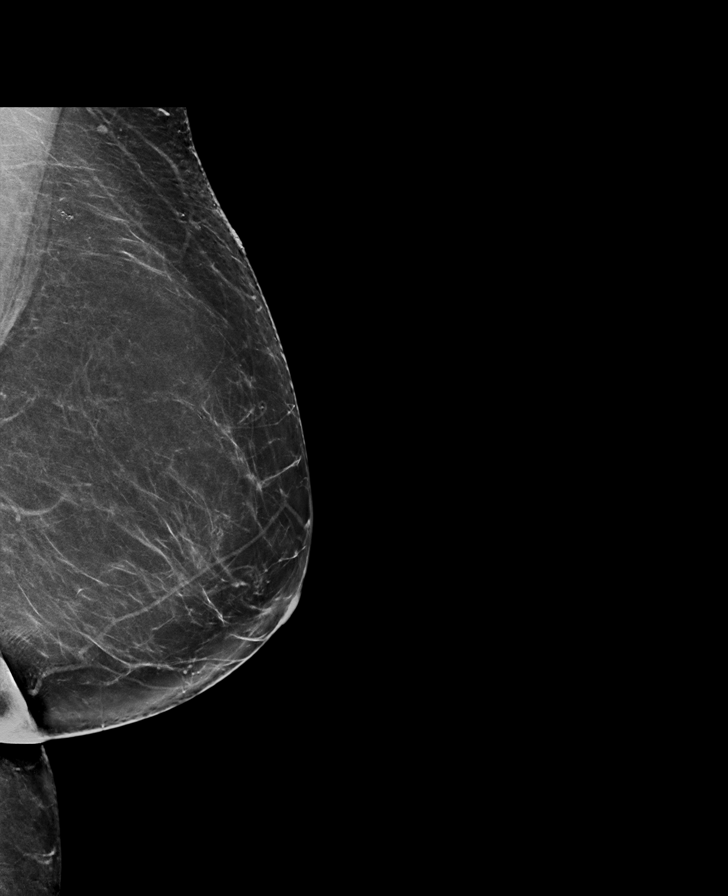

[R CC synth-2D]
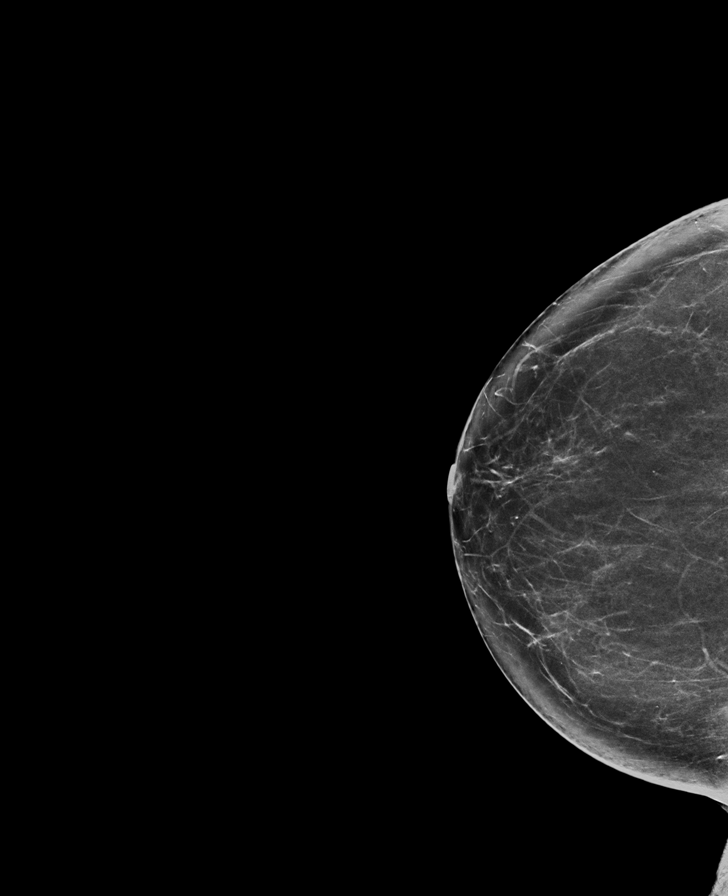

[R MLO synth-2D]
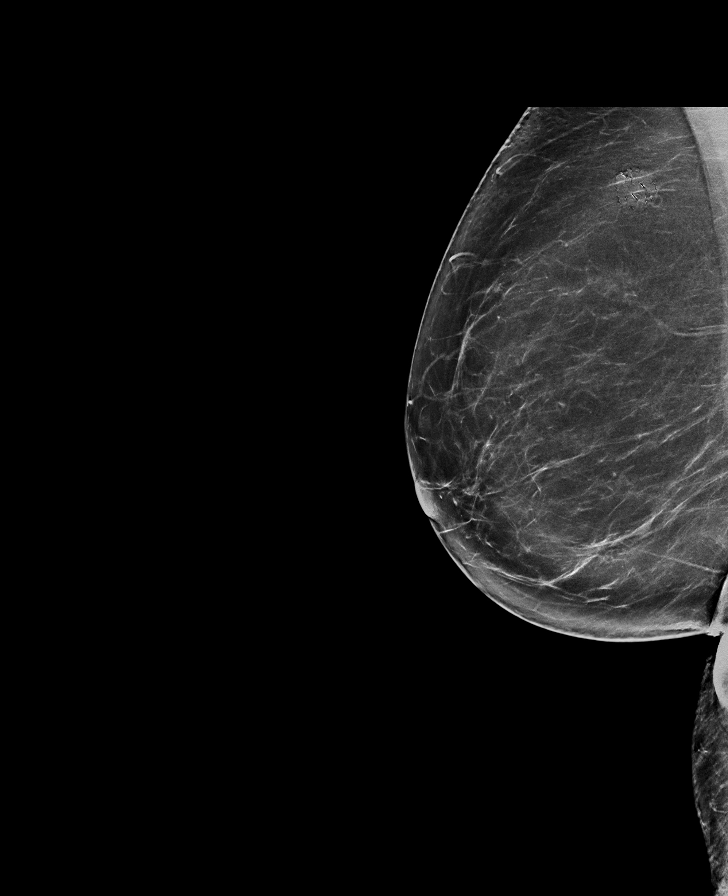

[L CC synth-2D]
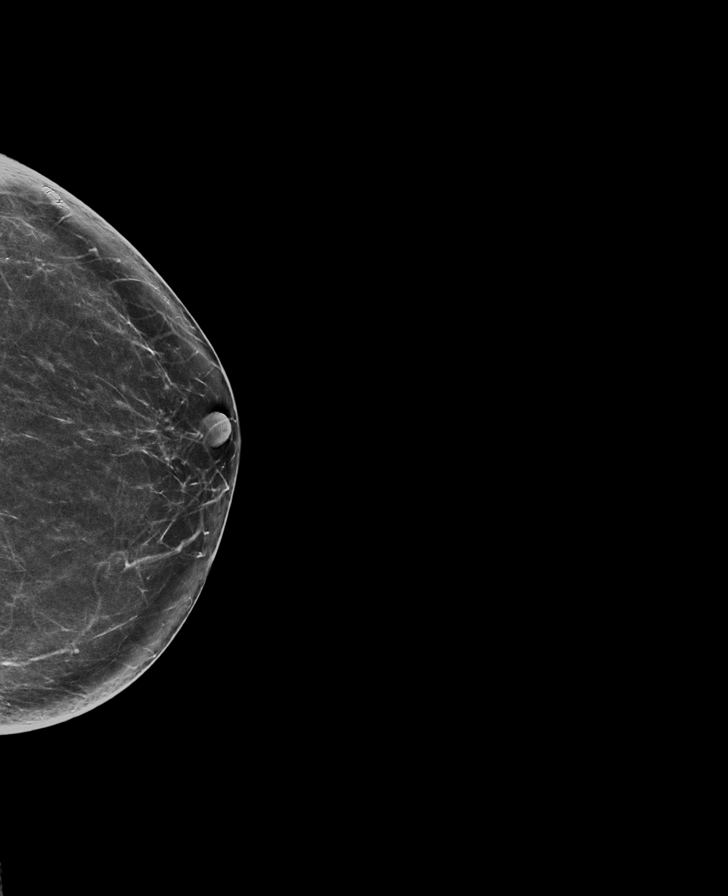

[L CC tomo · tomo slice 37/72.0]
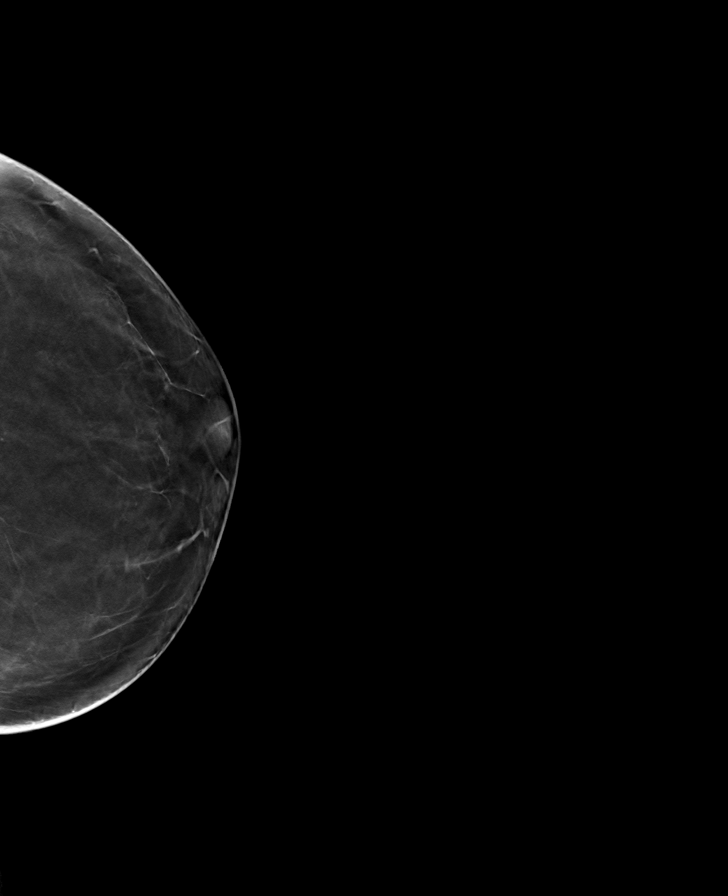

[R CC tomo · tomo slice 39/76.0]
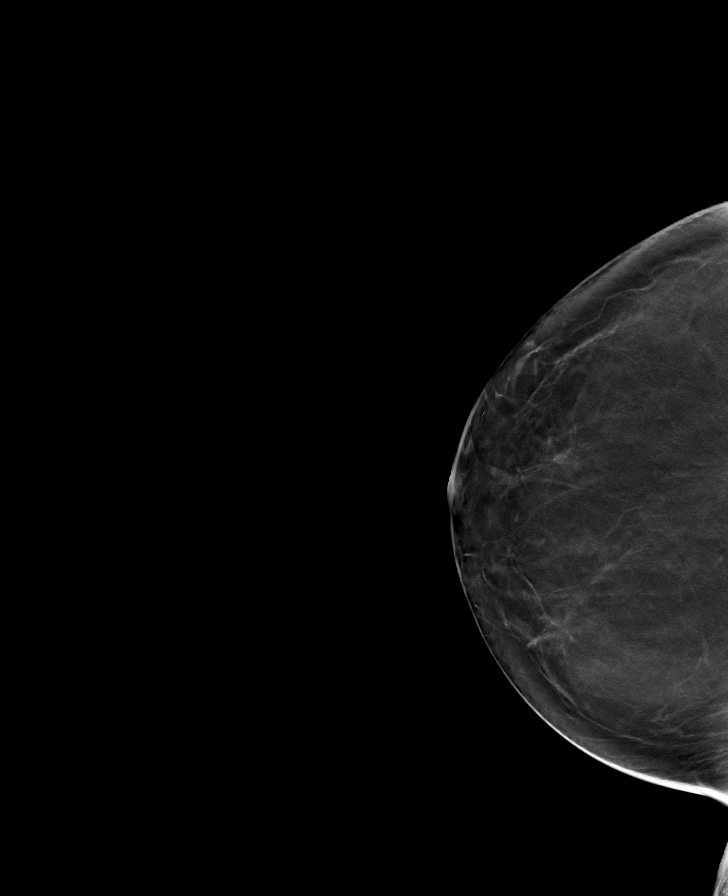

[L MLO tomo · tomo slice 40/79.0]
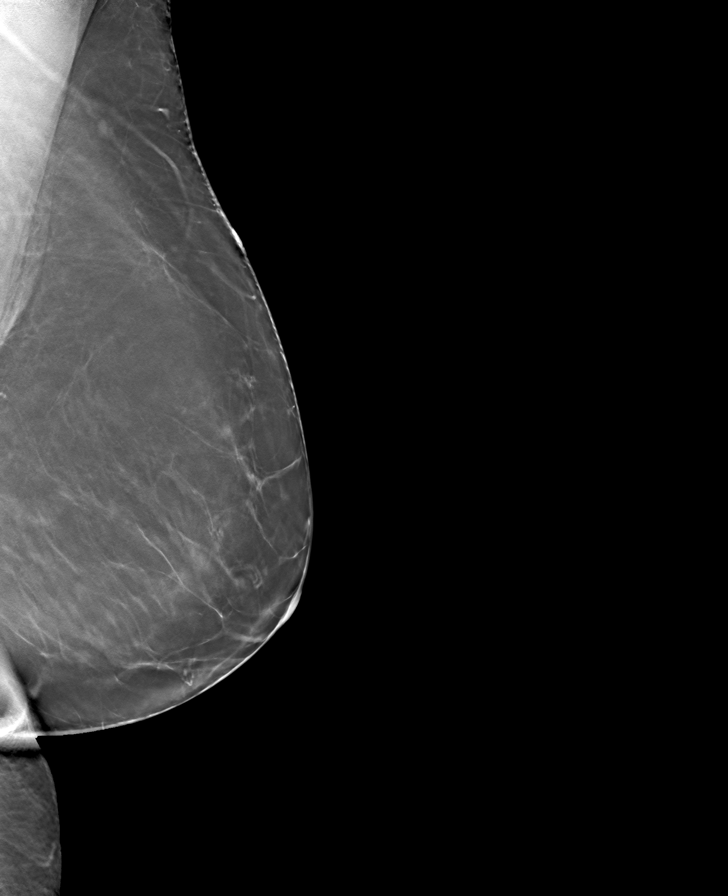

[R MLO tomo · tomo slice 42/83.0]
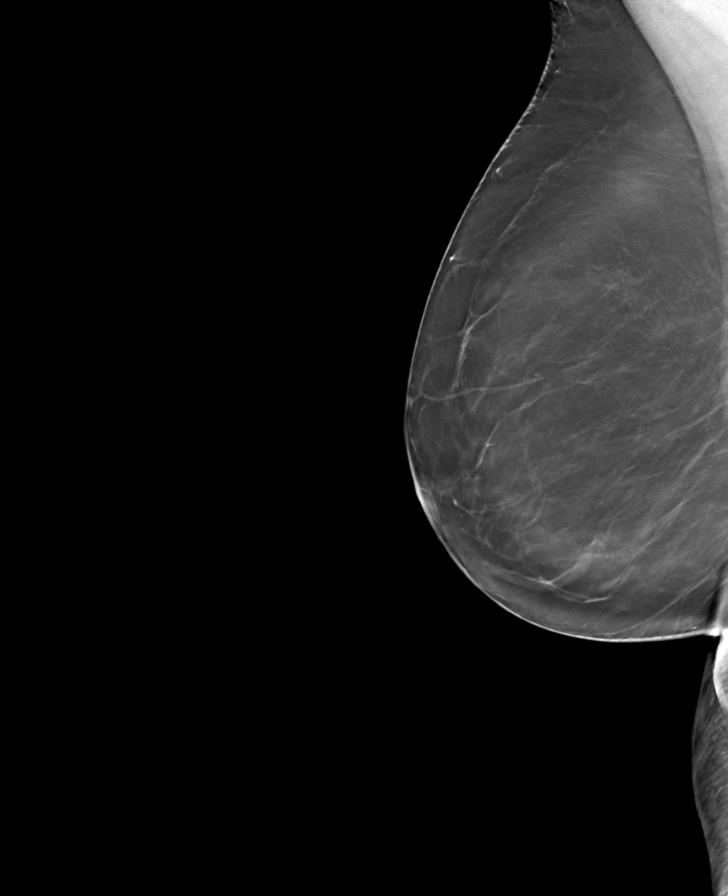

[8 of 24 positions shown; findings below may reference images not displayed]

FINDINGS: There are no findings suspicious for malignancy. The images were
evaluated with computer-aided detection.
IMPRESSION: No mammographic evidence of malignancy. A result letter of this
screening mammogram will be mailed directly to the patient.

RECOMMENDATION:
Screening mammogram in one year. (Code:[id number])

BI-RADS CATEGORY  1: Negative.

## 2022-04-03 ENCOUNTER — Encounter: Payer: Self-pay | Admitting: Internal Medicine

## 2022-05-27 ENCOUNTER — Encounter: Payer: Self-pay | Admitting: Registered Nurse

## 2022-05-27 ENCOUNTER — Ambulatory Visit (INDEPENDENT_AMBULATORY_CARE_PROVIDER_SITE_OTHER): Payer: 59 | Admitting: Registered Nurse

## 2022-05-27 DIAGNOSIS — I10 Essential (primary) hypertension: Secondary | ICD-10-CM | POA: Diagnosis not present

## 2022-05-27 MED ORDER — AMLODIPINE BESYLATE 5 MG PO TABS: 5.0000 mg | ORAL_TABLET | Freq: Every day | ORAL | 1 refills | Status: DC

## 2022-05-27 MED ORDER — ENALAPRIL MALEATE 20 MG PO TABS: 20.0000 mg | ORAL_TABLET | Freq: Two times a day (BID) | ORAL | 1 refills | Status: DC

## 2022-05-27 NOTE — Progress Notes (Signed)
Established Patient Office Visit  Subjective:  Patient ID: Sarah Mason, female    DOB: 08-May-1961  Age: 61 y.o. MRN: [id number]  CC:  Chief Complaint  Patient presents with   Hypertension    6 month follow up. Pt reports no concerns    HPI Sarah Mason presents for htn  Hypertension: Patient Currently taking: amlodipine '5mg'$  po qd, enalapril '20mg'$  po bid Good effect. No AEs. Denies CV symptoms including: chest pain, shob, doe, headache, visual changes, fatigue, claudication, and dependent edema.   Previous readings and labs: BP Readings from Last 3 Encounters:  05/27/22 128/76  12/11/21 138/90  11/12/21 120/74   Lab Results  Component Value Date   CREATININE 0.77 11/12/2021      Outpatient Medications Prior to Visit  Medication Sig Dispense Refill   Biotin w/ Vitamins C & E (HAIR/SKIN/NAILS PO) Take by mouth daily.     Cetirizine HCl (ZYRTEC PO) Take by mouth.     fexofenadine (ALLEGRA) 180 MG tablet Take 180 mg by mouth daily.     GLUCOSAMINE-CHONDROITIN DS PO Take by mouth daily.     Multiple Vitamin (MULTIVITAMIN) tablet Take 1 tablet by mouth daily.     amLODipine (NORVASC) 5 MG tablet Take 1 tablet (5 mg total) by mouth daily. 90 tablet 1   enalapril (VASOTEC) 20 MG tablet Take 1 tablet (20 mg total) by mouth 2 (two) times daily. 180 tablet 1   No facility-administered medications prior to visit.    Review of Systems  Constitutional: Negative.   HENT: Negative.    Eyes: Negative.   Respiratory: Negative.    Cardiovascular: Negative.   Gastrointestinal: Negative.   Genitourinary: Negative.   Musculoskeletal: Negative.   Skin: Negative.   Neurological: Negative.   Psychiatric/Behavioral: Negative.    All other systems reviewed and are negative.    Objective:     BP 128/76   Pulse 72   Temp (!) 97.2 F (36.2 C) (Temporal)   Resp 16   Ht '5\' 7"'$  (1.702 m)   Wt 167 lb 3.2 oz (75.8 kg)   LMP 12/11/2014 (Approximate)   SpO2 99%   BMI 26.19 kg/m    Wt Readings from Last 3 Encounters:  05/27/22 167 lb 3.2 oz (75.8 kg)  12/11/21 160 lb 9.6 oz (72.8 kg)  11/12/21 159 lb 12.8 oz (72.5 kg)   Physical Exam Vitals and nursing note reviewed.  Constitutional:      General: She is not in acute distress.    Appearance: Normal appearance. She is normal weight. She is not ill-appearing, toxic-appearing or diaphoretic.  Cardiovascular:     Rate and Rhythm: Normal rate and regular rhythm.     Heart sounds: Normal heart sounds. No murmur heard.   No friction rub. No gallop.  Pulmonary:     Effort: Pulmonary effort is normal. No respiratory distress.     Breath sounds: Normal breath sounds. No stridor. No wheezing, rhonchi or rales.  Chest:     Chest wall: No tenderness.  Skin:    General: Skin is warm and dry.  Neurological:     General: No focal deficit present.     Mental Status: She is alert and oriented to person, place, and time. Mental status is at baseline.  Psychiatric:        Mood and Affect: Mood normal.        Behavior: Behavior normal.        Thought Content: Thought content normal.  Judgment: Judgment normal.    No results found for any visits on 05/27/22.    The 10-year ASCVD risk score (Arnett DK, et al., 2019) is: 4.5%    Assessment & Plan:   Problem List Items Addressed This Visit       Cardiovascular and Mediastinum   Essential hypertension    Well controlled. Continue current regimen. Follow up in 6 mo for CPE and labs Patient encouraged to call clinic with any questions, comments, or concerns.         Relevant Medications   amLODipine (NORVASC) 5 MG tablet   enalapril (VASOTEC) 20 MG tablet    Meds ordered this encounter  Medications   amLODipine (NORVASC) 5 MG tablet    Sig: Take 1 tablet (5 mg total) by mouth daily.    Dispense:  90 tablet    Refill:  1    Order Specific Question:   Supervising Provider    Answer:   Carlota Raspberry, JEFFREY R [[id number]]   enalapril (VASOTEC) 20 MG tablet     Sig: Take 1 tablet (20 mg total) by mouth 2 (two) times daily.    Dispense:  180 tablet    Refill:  1    Order Specific Question:   Supervising Provider    Answer:   Carlota Raspberry, JEFFREY R [[id number]]    Return in about 6 months (around 11/26/2022) for CPE and labs.    Maximiano Coss, NP

## 2022-05-27 NOTE — Assessment & Plan Note (Signed)
Well controlled. Continue current regimen. Follow up in 6 mo for CPE and labs Patient encouraged to call clinic with any questions, comments, or concerns.

## 2022-05-27 NOTE — Patient Instructions (Signed)
Ms. Jessenia Filippone to see you  Blood pressure is doing very well.  Continue current meds.  Follow up in 6 months for a physical and lab work  Call sooner if you need anything!  Thanks,  Denice Paradise

## 2022-06-11 ENCOUNTER — Other Ambulatory Visit: Payer: Self-pay

## 2022-06-11 ENCOUNTER — Other Ambulatory Visit: Payer: Self-pay | Admitting: Internal Medicine

## 2022-06-11 DIAGNOSIS — R768 Other specified abnormal immunological findings in serum: Secondary | ICD-10-CM

## 2022-06-11 DIAGNOSIS — Z13228 Encounter for screening for other metabolic disorders: Secondary | ICD-10-CM

## 2022-06-11 DIAGNOSIS — Z13220 Encounter for screening for lipoid disorders: Secondary | ICD-10-CM

## 2022-06-12 LAB — HEPATIC FUNCTION PANEL
ALT: 17 IU/L (ref 0–32)
AST: 27 IU/L (ref 0–40)
Albumin: 4.9 g/dL (ref 3.8–4.9)
Alkaline Phosphatase: 85 IU/L (ref 44–121)
Bilirubin Total: 0.6 mg/dL (ref 0.0–1.2)
Bilirubin, Direct: 0.15 mg/dL (ref 0.00–0.40)
Total Protein: 7.4 g/dL (ref 6.0–8.5)

## 2022-06-12 LAB — COMPREHENSIVE METABOLIC PANEL
ALT: 18 IU/L (ref 0–32)
AST: 28 IU/L (ref 0–40)
Albumin/Globulin Ratio: 1.8 (ref 1.2–2.2)
Albumin: 4.7 g/dL (ref 3.8–4.9)
Alkaline Phosphatase: 82 IU/L (ref 44–121)
BUN/Creatinine Ratio: 21 (ref 12–28)
BUN: 17 mg/dL (ref 8–27)
Bilirubin Total: 0.6 mg/dL (ref 0.0–1.2)
CO2: 24 mmol/L (ref 20–29)
Calcium: 9.6 mg/dL (ref 8.7–10.3)
Chloride: 101 mmol/L (ref 96–106)
Creatinine, Ser: 0.8 mg/dL (ref 0.57–1.00)
Globulin, Total: 2.6 g/dL (ref 1.5–4.5)
Glucose: 101 mg/dL — ABNORMAL HIGH (ref 70–99)
Potassium: 4.4 mmol/L (ref 3.5–5.2)
Sodium: 141 mmol/L (ref 134–144)
Total Protein: 7.3 g/dL (ref 6.0–8.5)
eGFR: 84 mL/min/{1.73_m2} (ref >59)

## 2022-06-12 LAB — CBC WITH DIFFERENTIAL/PLATELET
Basophils Absolute: 0.1 10*3/uL (ref 0.0–0.2)
Basos: 1 %
EOS (ABSOLUTE): 0.1 10*3/uL (ref 0.0–0.4)
Eos: 3 %
Hematocrit: 43.9 % (ref 34.0–46.6)
Hemoglobin: 14.9 g/dL (ref 11.1–15.9)
Immature Grans (Abs): 0 10*3/uL (ref 0.0–0.1)
Immature Granulocytes: 0 %
Lymphocytes Absolute: 1.4 10*3/uL (ref 0.7–3.1)
Lymphs: 34 %
MCH: 28.8 pg (ref 26.6–33.0)
MCHC: 33.9 g/dL (ref 31.5–35.7)
MCV: 85 fL (ref 79–97)
Monocytes Absolute: 0.3 10*3/uL (ref 0.1–0.9)
Monocytes: 6 %
Neutrophils Absolute: 2.3 10*3/uL (ref 1.4–7.0)
Neutrophils: 56 %
Platelets: 189 10*3/uL (ref 150–450)
RBC: 5.18 x10E6/uL (ref 3.77–5.28)
RDW: 12.9 % (ref 11.7–15.4)
WBC: 4.2 10*3/uL (ref 3.4–10.8)

## 2022-06-12 LAB — LIPID PANEL
Chol/HDL Ratio: 3.8 ratio (ref 0.0–4.4)
Cholesterol, Total: 211 mg/dL — ABNORMAL HIGH (ref 100–199)
HDL: 55 mg/dL (ref >39)
LDL Chol Calc (NIH): 138 mg/dL — ABNORMAL HIGH (ref 0–99)
Triglycerides: 103 mg/dL (ref 0–149)
VLDL Cholesterol Cal: 18 mg/dL (ref 5–40)

## 2022-06-12 LAB — TSH: TSH: 1.33 u[IU]/mL (ref 0.450–4.500)

## 2022-06-13 LAB — HCV RNA QUANT: Hepatitis C Quantitation: NOT DETECTED IU/mL

## 2022-06-17 NOTE — Progress Notes (Signed)
GI Office Note    Referring Provider: Maximiano Coss, NP Primary Care Physician:  Maximiano Coss, NP Primary GI: Dr. Abbey Chatters  Date:  06/18/2022  ID:  Sarah Mason, DOB [birth year], MRN [id number]   Chief Complaint   Chief Complaint  Patient presents with   Follow-up    No current issues.      History of Present Illness  Sarah Mason is a 61 y.o. female with a history of HSV, HTN, ovarian cyst, and uterine fibroids presenting today for follow up.   Last seen in the office 12/11/21 or evaluation of Hepatitis C given recent positive antibody testing with physical. As previously stated, viral load was 229. She denied family history or personal history hepatitis C, denied illicit drug use or exposures that she was aware of.  No recent transfusions.  No history of accidental needlesticks or liver disease.  LFTs in November 2022 within normal limits.  No alarm symptoms present.  Recheck viral load, genotype, viral hepatitis B, HIV testing, and repeat liver function test.  Labs 12/11/2021 with HCVRNA PCR less than 15, quantitative log less than 1.18, genotype not detected.  Hep B surface antibody reactive, hep B surface antigen negative, hep B core antibody negative (immune to hepatitis B), HIV negative, LFTs within normal limits.  Last colonoscopy 2014 with 2 small hyperplastic polyps removed from the rectum.  No family history of colon cancer.  Due for repeat in 2024  Most recent labs 06/11/2022 - hep C quantitation not detected, TSH normal, LFTs within normal limits.   Today: Hemorrhoids -  burning and itching, no bleeding. Eats fiber and she is able to control her symptoms. Mostly external hemorrhoids.   Had a cardiology work up recently due to some chest pain but states it was related to drinking wine at night. Mostly related to reflux and has been good since. Will have milk or tums to relieve symptoms as they occur.   Gives blood a few times a year, she is O+ and gets her blood screened  every time she gives and has always been negative.    Past Medical History:  Diagnosis Date   Abnormal Pap smear    Bilateral ovarian cysts 12/08/2014   Exercise-induced asthma    worse in cold weather   Fibroids    HSV (herpes simplex virus) infection    Hypertension    Plantar fibromatosis    resolved   Thickened endometrium 12/08/2014    Past Surgical History:  Procedure Laterality Date   CARPAL TUNNEL RELEASE Left    COLONOSCOPY N/A 11/14/2013   Procedure: COLONOSCOPY;  Surgeon: Danie Binder, MD;  Location: [facility];  Service: Endoscopy;  Laterality: N/A;  10:30 AM   Dilatation and curettage     LASER ABLATION OF THE CERVIX     ORIF WRIST FRACTURE Left 01/02/2015   Procedure: OPEN REDUCTION INTERNAL FIXATION LEFT WRIST FRACTURE;  Surgeon: Roseanne Kaufman, MD;  Location: [facility];  Service: Orthopedics;  Laterality: Left;    Current Outpatient Medications  Medication Sig Dispense Refill   amLODipine (NORVASC) 5 MG tablet Take 1 tablet (5 mg total) by mouth daily. 90 tablet 1   Biotin w/ Vitamins C & E (HAIR/SKIN/NAILS PO) Take by mouth daily.     Cetirizine HCl (ZYRTEC PO) Take by mouth.     enalapril (VASOTEC) 20 MG tablet Take 1 tablet (20 mg total) by mouth 2 (two) times daily. 180 tablet 1   fexofenadine (ALLEGRA) 180 MG tablet Take  180 mg by mouth daily.     GLUCOSAMINE-CHONDROITIN DS PO Take by mouth daily.     Multiple Vitamin (MULTIVITAMIN) tablet Take 1 tablet by mouth daily.     No current facility-administered medications for this visit.    Allergies as of 06/18/2022 - Review Complete 06/18/2022  Allergen Reaction Noted   Doxycycline Hives and Rash 10/02/2011    Family History  Problem Relation Age of Onset   Diabetes Mother    Hypertension Mother    Hyperlipidemia Mother    Atrial fibrillation Mother    Diabetes Father    Hypertension Father    Hyperlipidemia Father    Heart attack Father        In his 18's   Lung disease Father        copd    Heart disease Maternal Grandmother    Arthritis Maternal Grandmother    Arthritis Maternal Grandfather    Lung disease Maternal Grandfather    Arthritis Paternal Grandmother    Stroke Paternal Grandmother    Arthritis Paternal Grandfather    Lung cancer Paternal Grandfather        worked in [place] History   Socioeconomic History   Marital status: Married    Spouse name: Not on file   Number of children: Not on file   Years of education: college 4y   Highest education level: Not on file  Occupational History   Occupation: Scientist, research (physical sciences): UNEMPLOYED  Tobacco Use   Smoking status: Former    Types: Cigarettes   Smokeless tobacco: Never   Tobacco comments:    Only in highschool  Vaping Use   Vaping Use: Never used  Substance and Sexual Activity   Alcohol use: Yes    Comment: One to two drinks per week   Drug use: No   Sexual activity: Yes    Birth control/protection: Surgical    Comment: vasectomy  Other Topics Concern   Not on file  Social History Narrative   Not on file   Social Determinants of Health   Financial Resource Strain: Low Risk  (01/31/2021)   Overall Financial Resource Strain (CARDIA)    Difficulty of Paying Living Expenses: Not hard at all  Food Insecurity: No Food Insecurity (01/31/2021)   Hunger Vital Sign    Worried About Running Out of Food in the Last Year: Never true    [place] in the Last Year: Never true  Transportation Needs: No Transportation Needs (01/31/2021)   PRAPARE - Hydrologist (Medical): No    Lack of Transportation (Non-Medical): No  Physical Activity: Sufficiently Active (01/31/2021)   Exercise Vital Sign    Days of Exercise per Week: 3 days    Minutes of Exercise per Session: 60 min  Stress: No Stress Concern Present (01/31/2021)   [place]    Feeling of Stress : Not at all  Social Connections: Moderately  Integrated (01/31/2021)   Social Connection and Isolation Panel [NHANES]    Frequency of Communication with Friends and Family: Three times a week    Frequency of Social Gatherings with Friends and Family: Three times a week    Attends Religious Services: Never    Active Member of Clubs or Organizations: No    Attends Archivist Meetings: 1 to 4 times per year    Marital Status: Married     Review of  Systems   Gen: Denies fever, chills, anorexia. Denies fatigue, weakness, weight loss.  CV: Denies chest pain, palpitations, syncope, peripheral edema, and claudication. Resp: Denies dyspnea at rest, cough, wheezing, coughing up blood, and pleurisy. GI: see HPI Derm: Denies rash, itching, dry skin Psych: Denies depression, anxiety, memory loss, confusion. No homicidal or suicidal ideation.  Heme: Denies bruising, bleeding, and enlarged lymph nodes.   Physical Exam   BP 111/75 (BP Location: Left Arm, Patient Position: Sitting, Cuff Size: Normal)   Pulse 76   Temp (!) 97.5 F (36.4 C) (Temporal)   Ht '5\' 7"'$  (1.702 m)   Wt 167 lb 6.4 oz (75.9 kg)   LMP 12/11/2014 (Approximate)   SpO2 96%   BMI 26.22 kg/m   General:   Alert and oriented. No distress noted. Pleasant and cooperative.  Head:  Normocephalic and atraumatic. Eyes:  Conjuctiva clear without scleral icterus. Mouth:  Oral mucosa pink and moist. Good dentition. No lesions. Lungs:  Clear to auscultation bilaterally. No wheezes, rales, or rhonchi. No distress.  Heart:  S1, S2 present without murmurs appreciated.  Abdomen:  +BS, soft, non-tender and non-distended. No rebound or guarding. No HSM or masses noted. Rectal: deferred Msk:  Symmetrical without gross deformities. Normal posture. Extremities:  Without edema. Neurologic:  Alert and  oriented x4 Psych:  Alert and cooperative. Normal mood and affect.   Assessment  Sarah Mason is a 61 y.o. female with a history of HSV, HTN, ovarian cyst, and uterine fibroids  presenting today for follow up of prior positive hepatitis C antibody  Prior positive hepatitis C antibody: Previously elevated hep C quantitative log and PCR (229) in November 2022.  Follow-up labs completed 04/11/2021 with negative HCVRNA PCR and quantitative log as well as genotype.  Found to be immune to hepatitis B, HIV was negative.  Initial evaluation in December revealed no risk factors.  Most recent labs repeated 06/11/2022 with negative HCV quantitation and LFTs within normal limits. No issues, gives blood a few times a year and has had negative testing with every visit. She continues to not have any exposure risks including needle sticks, drug use or any blood transfusions. Suspect false positive or she was exposed and her body cleared on its own. Given stable LFTs and 2 repeat testing with HCV not detected would recommend continuing to monitor LFTs at least yearly and follow up here as needed.   Hemorrhoids: Mostly external. Has occasional itching and burning but constipation is fairly controlled as long as she consumes an adequate amount of fiber. Discussed that if internal hemorrhoids are ever bothersome that hemorrhoid banding in the office is an option for her.   Dyspepsia: Had a workup with cardiology regarding chest pain that was negative and since she has stopped consuming wine in the evenings she has not had any issues. She does have occasional reflux symptoms primarily with more greasy foods that she controls with drinking a glass or milk or using tums.   Screening colonoscopy due in 2024, she is on the recall list for 2024.   PLAN   Monitor LFTs yearly. Follow up as needed in the office Recall for colonoscopy in place for 2024.    Venetia Night, MSN, FNP-BC, AGACNP-BC [facility]

## 2022-06-18 ENCOUNTER — Ambulatory Visit (INDEPENDENT_AMBULATORY_CARE_PROVIDER_SITE_OTHER): Payer: 59 | Admitting: Gastroenterology

## 2022-06-18 ENCOUNTER — Encounter: Payer: Self-pay | Admitting: Gastroenterology

## 2022-06-18 VITALS — BP 111/75 | HR 76 | Temp 97.5°F | Ht 67.0 in | Wt 167.4 lb

## 2022-06-18 DIAGNOSIS — R1013 Epigastric pain: Secondary | ICD-10-CM

## 2022-06-18 DIAGNOSIS — Z8619 Personal history of other infectious and parasitic diseases: Secondary | ICD-10-CM | POA: Diagnosis not present

## 2022-06-18 DIAGNOSIS — K649 Unspecified hemorrhoids: Secondary | ICD-10-CM | POA: Diagnosis not present

## 2022-06-18 NOTE — Patient Instructions (Signed)
Pleasure to meet you today!  I hope you have a great summer!  We have you on the recall list for February 2024 for your colonoscopy.  We will have you follow-up as needed.  It was a pleasure to see you today. I want to create trusting relationships with patients. If you receive a survey regarding your visit,  I greatly appreciate you taking time to fill this out on paper or through your MyChart. I value your feedback.  Venetia Night, MSN, FNP-BC, AGACNP-BC [facility]

## 2022-07-09 ENCOUNTER — Encounter (INDEPENDENT_AMBULATORY_CARE_PROVIDER_SITE_OTHER): Payer: 59 | Admitting: Ophthalmology

## 2022-07-09 DIAGNOSIS — I10 Essential (primary) hypertension: Secondary | ICD-10-CM | POA: Diagnosis not present

## 2022-07-09 DIAGNOSIS — H33301 Unspecified retinal break, right eye: Secondary | ICD-10-CM | POA: Diagnosis not present

## 2022-07-09 DIAGNOSIS — H35033 Hypertensive retinopathy, bilateral: Secondary | ICD-10-CM | POA: Diagnosis not present

## 2022-07-09 DIAGNOSIS — H43813 Vitreous degeneration, bilateral: Secondary | ICD-10-CM | POA: Diagnosis not present

## 2022-07-30 ENCOUNTER — Encounter: Payer: Self-pay | Admitting: Family Medicine

## 2022-07-30 ENCOUNTER — Ambulatory Visit (INDEPENDENT_AMBULATORY_CARE_PROVIDER_SITE_OTHER): Payer: 59 | Admitting: Family Medicine

## 2022-07-30 DIAGNOSIS — I10 Essential (primary) hypertension: Secondary | ICD-10-CM

## 2022-07-30 DIAGNOSIS — E785 Hyperlipidemia, unspecified: Secondary | ICD-10-CM | POA: Insufficient documentation

## 2022-07-30 DIAGNOSIS — E7800 Pure hypercholesterolemia, unspecified: Secondary | ICD-10-CM | POA: Diagnosis not present

## 2022-07-30 NOTE — Assessment & Plan Note (Signed)
No pharmacotherapy at this time.  Will continue to monitor. 

## 2022-07-30 NOTE — Patient Instructions (Signed)
Follow up annually.  Watch diet & stay active.  Take care  Dr. Lacinda Axon

## 2022-07-30 NOTE — Assessment & Plan Note (Signed)
Well-controlled.  Continue enalapril and amlodipine. 

## 2022-07-30 NOTE — Progress Notes (Signed)
Subjective:  Patient ID: Sarah Mason, female    DOB: 31-Aug-1961  Age: 61 y.o. MRN: [id number]  CC: Chief Complaint  Patient presents with   Establish Care    Pt was former pt of Dr.Steve.     HPI:  61 year old female with hypertension and hyperlipidemia presents to establish care with me.  Patient is doing well.  She states that she has no complaints at this time.  Hypertension is stable on enalapril and amlodipine.  Patient has hyperlipidemia.  Most recent LDL was 138.  No pharmacotherapy at this time.  Has occasional exercise-induced asthma.  Denies chest pain or shortness of breath.  Patient Active Problem List   Diagnosis Date Noted   Hyperlipidemia 07/30/2022   Essential hypertension 01/13/2015   Exercise-induced asthma 09/08/2012    Social Hx   Social History   Socioeconomic History   Marital status: Married    Spouse name: Not on file   Number of children: Not on file   Years of education: college 4y   Highest education level: Not on file  Occupational History   Occupation: Scientist, research (physical sciences): UNEMPLOYED  Tobacco Use   Smoking status: Former    Types: Cigarettes   Smokeless tobacco: Never   Tobacco comments:    Only in highschool  Vaping Use   Vaping Use: Never used  Substance and Sexual Activity   Alcohol use: Yes    Comment: One to two drinks per week   Drug use: No   Sexual activity: Yes    Birth control/protection: Surgical    Comment: vasectomy  Other Topics Concern   Not on file  Social History Narrative   Not on file   Social Determinants of Health   Financial Resource Strain: Low Risk  (01/31/2021)   Overall Financial Resource Strain (CARDIA)    Difficulty of Paying Living Expenses: Not hard at all  Food Insecurity: No Food Insecurity (01/31/2021)   Hunger Vital Sign    Worried About Running Out of Food in the Last Year: Never true    [place] in the Last Year: Never true  Transportation Needs: No Transportation Needs  (01/31/2021)   PRAPARE - Hydrologist (Medical): No    Lack of Transportation (Non-Medical): No  Physical Activity: Sufficiently Active (01/31/2021)   Exercise Vital Sign    Days of Exercise per Week: 3 days    Minutes of Exercise per Session: 60 min  Stress: No Stress Concern Present (01/31/2021)   [place]    Feeling of Stress : Not at all  Social Connections: Moderately Integrated (01/31/2021)   Social Connection and Isolation Panel [NHANES]    Frequency of Communication with Friends and Family: Three times a week    Frequency of Social Gatherings with Friends and Family: Three times a week    Attends Religious Services: Never    Active Member of Clubs or Organizations: No    Attends Archivist Meetings: 1 to 4 times per year    Marital Status: Married    Review of Systems Per HPI  Objective:  BP 121/78   Pulse 61   Temp 98.1 F (36.7 C)   Ht 5' 5.25" (1.657 m)   Wt 169 lb 9.6 oz (76.9 kg)   LMP 12/11/2014 (Approximate)   SpO2 100%   BMI 28.01 kg/m      07/30/2022   11:01 AM 06/18/2022  8:31 AM 05/27/2022    8:04 AM  BP/Weight  Systolic BP 174 944 967  Diastolic BP 78 75 76  Wt. (Lbs) 169.6 167.4 167.2  BMI 28.01 kg/m2 26.22 kg/m2 26.19 kg/m2    Physical Exam Vitals and nursing note reviewed.  Constitutional:      General: She is not in acute distress.    Appearance: Normal appearance.  HENT:     Head: Normocephalic and atraumatic.  Eyes:     General:        Right eye: No discharge.        Left eye: No discharge.     Conjunctiva/sclera: Conjunctivae normal.  Cardiovascular:     Rate and Rhythm: Normal rate and regular rhythm.  Pulmonary:     Effort: Pulmonary effort is normal.     Breath sounds: Normal breath sounds. No wheezing, rhonchi or rales.  Abdominal:     General: There is no distension.     Palpations: Abdomen is soft.     Tenderness: There  is no abdominal tenderness.  Neurological:     Mental Status: She is alert.  Psychiatric:        Mood and Affect: Mood normal.        Behavior: Behavior normal.     Lab Results  Component Value Date   WBC 4.2 06/11/2022   HGB 14.9 06/11/2022   HCT 43.9 06/11/2022   PLT 189 06/11/2022   GLUCOSE 101 (H) 06/11/2022   CHOL 211 (H) 06/11/2022   TRIG 103 06/11/2022   HDL 55 06/11/2022   LDLCALC 138 (H) 06/11/2022   ALT 18 06/11/2022   ALT 17 06/11/2022   AST 28 06/11/2022   AST 27 06/11/2022   NA 141 06/11/2022   K 4.4 06/11/2022   CL 101 06/11/2022   CREATININE 0.80 06/11/2022   BUN 17 06/11/2022   CO2 24 06/11/2022   TSH 1.330 06/11/2022   HGBA1C 5.3 11/12/2021     Assessment & Plan:   Problem List Items Addressed This Visit       Cardiovascular and Mediastinum   Essential hypertension    Well-controlled.  Continue enalapril and amlodipine.        Other   Hyperlipidemia    No pharmacotherapy at this time.  Will continue to monitor.      Follow-up:  Return in about 1 year (around 07/31/2023).  [place]

## 2022-11-20 ENCOUNTER — Other Ambulatory Visit: Payer: Self-pay | Admitting: Family Medicine

## 2022-11-20 DIAGNOSIS — I10 Essential (primary) hypertension: Secondary | ICD-10-CM

## 2022-11-26 ENCOUNTER — Ambulatory Visit: Payer: 59 | Admitting: Registered Nurse

## 2023-03-18 ENCOUNTER — Other Ambulatory Visit (HOSPITAL_COMMUNITY): Payer: Self-pay | Admitting: Obstetrics & Gynecology

## 2023-03-18 DIAGNOSIS — Z1231 Encounter for screening mammogram for malignant neoplasm of breast: Secondary | ICD-10-CM

## 2023-03-25 ENCOUNTER — Encounter (HOSPITAL_COMMUNITY): Payer: Self-pay

## 2023-03-25 ENCOUNTER — Ambulatory Visit (HOSPITAL_COMMUNITY)
Admission: RE | Admit: 2023-03-25 | Discharge: 2023-03-25 | Disposition: A | Discharge status: Home / Self Care | Payer: 59 | Source: Ambulatory Visit | Attending: Obstetrics & Gynecology | Admitting: Obstetrics & Gynecology

## 2023-03-25 DIAGNOSIS — Z1231 Encounter for screening mammogram for malignant neoplasm of breast: Secondary | ICD-10-CM

## 2023-05-05 ENCOUNTER — Encounter: Payer: Self-pay | Admitting: Family Medicine

## 2023-05-05 ENCOUNTER — Other Ambulatory Visit: Payer: Self-pay | Admitting: Family Medicine

## 2023-05-05 DIAGNOSIS — I10 Essential (primary) hypertension: Secondary | ICD-10-CM

## 2023-05-05 MED ORDER — AMLODIPINE BESYLATE 5 MG PO TABS: ORAL_TABLET | ORAL | 3 refills | Status: DC

## 2023-05-05 MED ORDER — ENALAPRIL MALEATE 20 MG PO TABS: ORAL_TABLET | ORAL | 3 refills | Status: DC

## 2023-07-29 ENCOUNTER — Telehealth: Payer: Self-pay | Admitting: *Deleted

## 2023-07-29 ENCOUNTER — Ambulatory Visit (INDEPENDENT_AMBULATORY_CARE_PROVIDER_SITE_OTHER): Payer: 59 | Admitting: Family Medicine

## 2023-07-29 VITALS — BP 128/83 | HR 61 | Ht 65.25 in | Wt 171.8 lb

## 2023-07-29 DIAGNOSIS — Z130 Encounter for screening for diseases of the blood and blood-forming organs and certain disorders involving the immune mechanism: Secondary | ICD-10-CM | POA: Diagnosis not present

## 2023-07-29 DIAGNOSIS — E7800 Pure hypercholesterolemia, unspecified: Secondary | ICD-10-CM | POA: Diagnosis not present

## 2023-07-29 DIAGNOSIS — I10 Essential (primary) hypertension: Secondary | ICD-10-CM | POA: Diagnosis not present

## 2023-07-29 MED ORDER — ENALAPRIL MALEATE 20 MG PO TABS
ORAL_TABLET | ORAL | 3 refills | Status: DC
Start: 2023-07-29 — End: 2024-07-28

## 2023-07-29 MED ORDER — AMLODIPINE BESYLATE 5 MG PO TABS: ORAL_TABLET | ORAL | 3 refills | Status: DC

## 2023-07-29 NOTE — Progress Notes (Signed)
Subjective:  Patient ID: Sarah Mason, female    DOB: January 21, 1961  Age: 62 y.o. MRN: [id number]  CC: Chief Complaint  Patient presents with   Follow-up    Medication check up    HPI:  89 female presents for follow-up  Patient's blood pressure is well-controlled.  He is on enalapril and amlodipine.  Tolerating well.  No side effects.  Patient needs labs.  Has not had labs since last year.  Last LDL 138.  Patient is due for colonoscopy.  Will reach out to GI so that this can be scheduled.  Patient Active Problem List   Diagnosis Date Noted   Hyperlipidemia 07/30/2022   Essential hypertension 01/13/2015   Exercise-induced asthma 09/08/2012    Social Hx   Social History   Socioeconomic History   Marital status: Married    Spouse name: Not on file   Number of children: Not on file   Years of education: college 4y   Highest education level: Not on file  Occupational History   Occupation: Dentist: UNEMPLOYED  Tobacco Use   Smoking status: Former    Types: Cigarettes   Smokeless tobacco: Never   Tobacco comments:    Only in highschool  Vaping Use   Vaping status: Never Used  Substance and Sexual Activity   Alcohol use: Yes    Comment: One to two drinks per week   Drug use: No   Sexual activity: Yes    Birth control/protection: Surgical    Comment: vasectomy  Other Topics Concern   Not on file  Social History Narrative   Not on file   Social Determinants of Health   Financial Resource Strain: Low Risk  (01/31/2021)   Overall Financial Resource Strain (CARDIA)    Difficulty of Paying Living Expenses: Not hard at all  Food Insecurity: No Food Insecurity (01/31/2021)   Hunger Vital Sign    Worried About Running Out of Food in the Last Year: Never true    Ran Out of Food in the Last Year: Never true  Transportation Needs: No Transportation Needs (01/31/2021)   PRAPARE - Administrator, Civil Service (Medical): No    Lack of Transportation  (Non-Medical): No  Physical Activity: Sufficiently Active (01/31/2021)   Exercise Vital Sign    Days of Exercise per Week: 3 days    Minutes of Exercise per Session: 60 min  Stress: No Stress Concern Present (01/31/2021)   [employer] of Occupational Health - Occupational Stress Questionnaire    Feeling of Stress : Not at all  Social Connections: Moderately Integrated (01/31/2021)   Social Connection and Isolation Panel [NHANES]    Frequency of Communication with Friends and Family: Three times a week    Frequency of Social Gatherings with Friends and Family: Three times a week    Attends Religious Services: Never    Active Member of Clubs or Organizations: No    Attends Banker Meetings: 1 to 4 times per year    Marital Status: Married    Review of Systems  Constitutional: Negative.   Respiratory: Negative.    Cardiovascular: Negative.     Objective:  BP 128/83   Pulse 61   Ht 5' 5.25" (1.657 m)   Wt 171 lb 12.8 oz (77.9 kg)   LMP 12/11/2014 (Approximate)   SpO2 99%   BMI 28.37 kg/m      07/29/2023    2:52 PM 07/30/2022   11:01 AM 06/18/2022  8:31 AM  BP/Weight  Systolic BP 128 121 111  Diastolic BP 83 78 75  Wt. (Lbs) 171.8 169.6 167.4  BMI 28.37 kg/m2 28.01 kg/m2 26.22 kg/m2    Physical Exam Vitals and nursing note reviewed.  Constitutional:      General: She is not in acute distress.    Appearance: Normal appearance.  HENT:     Head: Normocephalic and atraumatic.     Nose: Nose normal.  Eyes:     General:        Right eye: No discharge.        Left eye: No discharge.     Conjunctiva/sclera: Conjunctivae normal.  Cardiovascular:     Rate and Rhythm: Normal rate and regular rhythm.  Pulmonary:     Effort: Pulmonary effort is normal.     Breath sounds: Normal breath sounds. No wheezing, rhonchi or rales.  Neurological:     Mental Status: She is alert.  Psychiatric:        Mood and Affect: Mood normal.        Behavior: Behavior normal.      Lab Results  Component Value Date   WBC 4.2 06/11/2022   HGB 14.9 06/11/2022   HCT 43.9 06/11/2022   PLT 189 06/11/2022   GLUCOSE 101 (H) 06/11/2022   CHOL 211 (H) 06/11/2022   TRIG 103 06/11/2022   HDL 55 06/11/2022   LDLCALC 138 (H) 06/11/2022   ALT 18 06/11/2022   ALT 17 06/11/2022   AST 28 06/11/2022   AST 27 06/11/2022   NA 141 06/11/2022   K 4.4 06/11/2022   CL 101 06/11/2022   CREATININE 0.80 06/11/2022   BUN 17 06/11/2022   CO2 24 06/11/2022   TSH 1.330 06/11/2022   HGBA1C 5.3 11/12/2021     Assessment & Plan:   Problem List Items Addressed This Visit       Cardiovascular and Mediastinum   Essential hypertension - Primary    Stable.  Continue current medications.  Refilled today.      Relevant Medications   amLODipine (NORVASC) 5 MG tablet   enalapril (VASOTEC) 20 MG tablet   Other Relevant Orders   CMP14+EGFR     Other   Hyperlipidemia    Unsure of control.  Needs labs to assess.      Relevant Medications   amLODipine (NORVASC) 5 MG tablet   enalapril (VASOTEC) 20 MG tablet   Other Relevant Orders   Lipid panel   Other Visit Diagnoses     Screening for deficiency anemia       Relevant Orders   CBC       Meds ordered this encounter  Medications   amLODipine (NORVASC) 5 MG tablet    Sig: TAKE 1 TABLET(5 MG) BY MOUTH DAILY    Dispense:  90 tablet    Refill:  3   enalapril (VASOTEC) 20 MG tablet    Sig: TAKE 1 TABLET(20 MG) BY MOUTH TWICE DAILY    Dispense:  180 tablet    Refill:  3    Follow-up: 6 months to 1 year  Shrihaan Porzio DO [facility]

## 2023-07-29 NOTE — Patient Instructions (Addendum)
Labs ordered.  Continue your medications.  Follow up in 6 months to 1 year.

## 2023-07-29 NOTE — Assessment & Plan Note (Signed)
Unsure of control.  Needs labs to assess.

## 2023-07-29 NOTE — Assessment & Plan Note (Signed)
Stable.  Continue current medications.  Refilled today. 

## 2023-07-29 NOTE — Telephone Encounter (Signed)
Per Dr. Marletta Lor schedule screening colonoscopy ASA 2. No OV needed prior.  Will call patient to schedule once we get Sept schedule.

## 2023-08-05 NOTE — Telephone Encounter (Signed)
LMOVM to call back to schedule 

## 2023-08-13 LAB — CMP14+EGFR
Alkaline Phosphatase: 87 IU/L (ref 44–121)
Calcium: 9.3 mg/dL (ref 8.7–10.3)
Globulin, Total: 2.3 g/dL (ref 1.5–4.5)

## 2023-08-13 LAB — LIPID PANEL
HDL: 44 mg/dL (ref >39)
VLDL Cholesterol Cal: 29 mg/dL (ref 5–40)

## 2023-08-27 NOTE — Telephone Encounter (Signed)
Pt left vm to schedule colonoscopy.  Advised pt that the only day provider had was on 9/24. She states she will be out of town then until 10/5. Will call pt once we get providers schedule for Oct.

## 2023-09-08 MED ORDER — NA SULFATE-K SULFATE-MG SULF 17.5-3.13-1.6 GM/177ML PO SOLN: ORAL | 0 refills | Status: DC

## 2023-09-08 NOTE — Telephone Encounter (Signed)
Spoke with pt. She scheduled for 10/21. Aware she will get a pre-op phone call with arrival time for procedure. Instructions to be sent via mychart. Rx for prep sent to [facility].

## 2023-09-08 NOTE — Addendum Note (Signed)
Addended by: Armstead Peaks on: 09/08/2023 11:16 AM   Modules accepted: Orders

## 2023-10-07 ENCOUNTER — Encounter (HOSPITAL_COMMUNITY): Admission: RE | Admit: 2023-10-07 | Payer: 59 | Source: Ambulatory Visit

## 2023-10-12 ENCOUNTER — Encounter (HOSPITAL_COMMUNITY): Admission: RE | Payer: Self-pay | Source: Home / Self Care

## 2023-10-12 ENCOUNTER — Ambulatory Visit (HOSPITAL_COMMUNITY): Admission: RE | Admit: 2023-10-12 | Payer: 59 | Source: Home / Self Care

## 2023-10-12 SURGERY — COLONOSCOPY WITH PROPOFOL
Anesthesia: Monitor Anesthesia Care

## 2023-11-30 ENCOUNTER — Other Ambulatory Visit: Payer: Self-pay | Admitting: Adult Health

## 2023-11-30 MED ORDER — ACYCLOVIR 200 MG PO CAPS: 200.0000 mg | ORAL_CAPSULE | Freq: Every day | ORAL | 2 refills | Status: AC

## 2024-01-05 ENCOUNTER — Telehealth: Payer: Self-pay | Admitting: *Deleted

## 2024-01-05 NOTE — Telephone Encounter (Signed)
 Patient called in to reschedule her procedure she had to cancel. Patient now on for 2/17. She still has her prep. New instructions to be sent to pt.   PA done via [facility],  Authorization Number: [id number] Case Number: [id number] Review Date: 01/05/2024 1:55:55 PM Expiration Date: 04/04/2024 Status: Your case has been Approved.

## 2024-02-03 ENCOUNTER — Encounter (HOSPITAL_COMMUNITY): Payer: Self-pay

## 2024-02-03 ENCOUNTER — Other Ambulatory Visit: Payer: Self-pay

## 2024-02-03 ENCOUNTER — Encounter (HOSPITAL_COMMUNITY)
Admission: RE | Admit: 2024-02-03 | Discharge: 2024-02-03 | Disposition: A | Discharge status: Home / Self Care | Payer: 59 | Source: Ambulatory Visit | Attending: Internal Medicine | Admitting: Internal Medicine

## 2024-02-03 HISTORY — DX: Unspecified osteoarthritis, unspecified site: M19.90

## 2024-02-03 NOTE — Pre-Procedure Instructions (Signed)
Attempted pre-op phonecall. Left VM for her to call us back.

## 2024-02-08 ENCOUNTER — Encounter (HOSPITAL_COMMUNITY)
Admission: RE | Disposition: A | Discharge status: Home / Self Care | Payer: Self-pay | Source: Home / Self Care | Attending: Internal Medicine

## 2024-02-08 ENCOUNTER — Ambulatory Visit (HOSPITAL_COMMUNITY)
Admission: RE | Admit: 2024-02-08 | Discharge: 2024-02-08 | Disposition: A | Discharge status: Home / Self Care | Payer: 59 | Attending: Internal Medicine | Admitting: Internal Medicine

## 2024-02-08 ENCOUNTER — Ambulatory Visit (HOSPITAL_COMMUNITY): Payer: 59 | Admitting: Anesthesiology

## 2024-02-08 ENCOUNTER — Encounter (HOSPITAL_COMMUNITY): Payer: Self-pay | Admitting: Internal Medicine

## 2024-02-08 DIAGNOSIS — Z1211 Encounter for screening for malignant neoplasm of colon: Secondary | ICD-10-CM | POA: Diagnosis present

## 2024-02-08 DIAGNOSIS — D120 Benign neoplasm of cecum: Secondary | ICD-10-CM

## 2024-02-08 DIAGNOSIS — D123 Benign neoplasm of transverse colon: Secondary | ICD-10-CM | POA: Diagnosis not present

## 2024-02-08 DIAGNOSIS — K648 Other hemorrhoids: Secondary | ICD-10-CM | POA: Insufficient documentation

## 2024-02-08 DIAGNOSIS — K635 Polyp of colon: Secondary | ICD-10-CM | POA: Diagnosis not present

## 2024-02-08 DIAGNOSIS — J45909 Unspecified asthma, uncomplicated: Secondary | ICD-10-CM | POA: Diagnosis not present

## 2024-02-08 DIAGNOSIS — Z87891 Personal history of nicotine dependence: Secondary | ICD-10-CM | POA: Insufficient documentation

## 2024-02-08 DIAGNOSIS — I10 Essential (primary) hypertension: Secondary | ICD-10-CM | POA: Insufficient documentation

## 2024-02-08 DIAGNOSIS — K649 Unspecified hemorrhoids: Secondary | ICD-10-CM

## 2024-02-08 HISTORY — PX: COLONOSCOPY WITH PROPOFOL: SHX5780

## 2024-02-08 HISTORY — PX: POLYPECTOMY: SHX5525

## 2024-02-08 SURGERY — COLONOSCOPY WITH PROPOFOL
Anesthesia: General

## 2024-02-08 MED ORDER — LIDOCAINE HCL (CARDIAC) PF 100 MG/5ML IV SOSY
PREFILLED_SYRINGE | INTRAVENOUS | Status: DC | PRN
Start: 2024-02-08 — End: 2024-02-08
  Administered 2024-02-08: 60 mg via INTRATRACHEAL

## 2024-02-08 MED ORDER — LACTATED RINGERS IV SOLN: INTRAVENOUS | Status: DC | PRN

## 2024-02-08 MED ORDER — PROPOFOL 500 MG/50ML IV EMUL
INTRAVENOUS | Status: DC | PRN
  Administered 2024-02-08: 125 ug/kg/min via INTRAVENOUS

## 2024-02-08 MED ORDER — LIDOCAINE HCL (CARDIAC) PF 100 MG/5ML IV SOSY: PREFILLED_SYRINGE | INTRAVENOUS | Status: DC | PRN

## 2024-02-08 MED ORDER — PROPOFOL 10 MG/ML IV BOLUS
INTRAVENOUS | Status: DC | PRN
  Administered 2024-02-08: 50 mg via INTRAVENOUS

## 2024-02-08 NOTE — Transfer of Care (Addendum)
Immediate Anesthesia Transfer of Care Note  Patient: Loma Newton  Procedure(s) Performed: COLONOSCOPY WITH PROPOFOL POLYPECTOMY  Patient Location: Short Stay  Anesthesia Type:General  Level of Consciousness: drowsy and patient cooperative  Airway & Oxygen Therapy: Patient Spontanous Breathing and Patient connected to nasal cannula oxygen  Post-op Assessment: Report given to RN and Post -op Vital signs reviewed and stable  Post vital signs: Reviewed and stable  Last Vitals:  Vitals Value Taken Time  BP 90/61 02/08/24   0848  Temp 36.5 02/08/24   0846  Pulse 69 02/08/24   0846  Resp 16 02/08/24   0846  SpO2 98% 02/08/24   0846    Last Pain:  Vitals:   02/08/24 0816  PainSc: 0-No pain         Complications: No notable events documented.

## 2024-02-08 NOTE — Op Note (Signed)
[facility] Patient Name: Sarah Mason Procedure Date: 02/08/2024 7:58 AM MRN: [id number] Date of Birth: 1961-11-05 Attending MD: Hennie Duos. Marletta Lor , [place], [id number] CSN: [id number] Age: 63 Admit Type: Outpatient Procedure:                Colonoscopy Indications:              Screening for colorectal malignant neoplasm Providers:                Hennie Duos. Marletta Lor, DO, Angelica Ran, Dyann Ruddle Referring MD:              Medicines:                See the Anesthesia note for documentation of the                            administered medications Complications:            No immediate complications. Estimated Blood Loss:     Estimated blood loss was minimal. Procedure:                Pre-Anesthesia Assessment:                           - The anesthesia plan was to use monitored                            anesthesia care (MAC).                           After obtaining informed consent, the colonoscope                            was passed under direct vision. Throughout the                            procedure, the patient's blood pressure, pulse, and                            oxygen saturations were monitored continuously. The                            [id number]-HQ190L ([id number]) scope was introduced through                            the anus and advanced to the the cecum, identified                            by appendiceal orifice and ileocecal valve. The                            colonoscopy was performed without difficulty. The                            patient tolerated the procedure well. The quality  of the bowel preparation was evaluated using the                            BBPS [facility] Bowel Preparation Scale) with scores                            of: Right Colon = 3, Transverse Colon = 3 and Left                            Colon = 3 (entire mucosa seen well with no residual                            staining, small fragments of stool or opaque                             liquid). The total BBPS score equals 9. Scope In: 8:22:17 AM Scope Out: 8:40:27 AM Scope Withdrawal Time: 0 hours 12 minutes 36 seconds  Total Procedure Duration: 0 hours 18 minutes 10 seconds  Findings:      Non-bleeding internal hemorrhoids were found during retroflexion.      An 8 mm polyp was found in the cecum. The polyp was sessile. The polyp       was removed with a cold snare. Resection and retrieval were complete.      A 7 mm polyp was found in the transverse colon. The polyp was sessile.       The polyp was removed with a cold snare. Resection and retrieval were       complete.      The exam was otherwise without abnormality. Impression:               - Non-bleeding internal hemorrhoids.                           - One 8 mm polyp in the cecum, removed with a cold                            snare. Resected and retrieved.                           - One 7 mm polyp in the transverse colon, removed                            with a cold snare. Resected and retrieved.                           - The examination was otherwise normal. Moderate Sedation:      Per Anesthesia Care Recommendation:           - Patient has a contact number available for                            emergencies. The signs and symptoms of potential                            delayed  complications were discussed with the                            patient. Return to normal activities tomorrow.                            Written discharge instructions were provided to the                            patient.                           - Resume previous diet.                           - Continue present medications.                           - Await pathology results.                           - Repeat colonoscopy in 7 years for surveillance.                           - Return to GI clinic in 3 months. Consider                            hemorrhoid banding. Procedure Code(s):        ---  Professional ---                           [phone], Colonoscopy, flexible; with removal of                            tumor(s), polyp(s), or other lesion(s) by snare                            technique Diagnosis Code(s):        --- Professional ---                           Z12.11, Encounter for screening for malignant                            neoplasm of colon                           D12.0, Benign neoplasm of cecum                           D12.3, Benign neoplasm of transverse colon (hepatic                            flexure or splenic flexure)                           K64.8, Other hemorrhoids CPT copyright 2022 American Medical Association. All  rights reserved. The codes documented in this report are preliminary and upon coder review may  be revised to meet current compliance requirements. Hennie Duos. Marletta Lor, DO Hennie Duos. Marletta Lor, DO 02/08/2024 8:43:46 AM This report has been signed electronically. Number of Addenda: 0

## 2024-02-08 NOTE — H&P (Signed)
Primary Care Physician:  Tommie Sams, DO Primary Gastroenterologist:  Dr. Marletta Lor  Pre-Procedure History & Physical: HPI:  Sarah Mason is a 63 y.o. female is here for a colonoscopy for colon cancer screening purposes.    Past Medical History:  Diagnosis Date   Abnormal Pap smear    Arthritis    Bilateral ovarian cysts 12/08/2014   Exercise-induced asthma    worse in cold weather   Fibroids    HSV (herpes simplex virus) infection    Hypertension    Plantar fibromatosis    resolved   Thickened endometrium 12/08/2014    Past Surgical History:  Procedure Laterality Date   CARPAL TUNNEL RELEASE Left    COLONOSCOPY N/A 11/14/2013   Procedure: COLONOSCOPY;  Surgeon: West Bali, MD;  Location: [facility] SUITE;  Service: Endoscopy;  Laterality: N/A;  10:30 AM   Dilatation and curettage     LASER ABLATION OF THE CERVIX     ORIF WRIST FRACTURE Left 01/02/2015   Procedure: OPEN REDUCTION INTERNAL FIXATION LEFT WRIST FRACTURE;  Surgeon: Dominica Severin, MD;  Location: [facility] OR;  Service: Orthopedics;  Laterality: Left;   RETINAL LASER PROCEDURE Right     Prior to Admission medications   Medication Sig Start Date End Date Taking? Authorizing Provider  amLODipine (NORVASC) 5 MG tablet TAKE 1 TABLET(5 MG) BY MOUTH DAILY 07/29/23  Yes Cook, Jayce G, DO  Biotin w/ Vitamins C & E (HAIR/SKIN/NAILS PO) Take by mouth daily.   Yes [provider]  Cetirizine HCl (ZYRTEC PO) Take by mouth.   Yes [provider]  enalapril (VASOTEC) 20 MG tablet TAKE 1 TABLET(20 MG) BY MOUTH TWICE DAILY 07/29/23  Yes Cook, Jayce G, DO  fexofenadine (ALLEGRA) 180 MG tablet Take 180 mg by mouth daily.   Yes [provider]  GLUCOSAMINE-CHONDROITIN DS PO Take by mouth daily.   Yes [provider]  Multiple Vitamin (MULTIVITAMIN) tablet Take 1 tablet by mouth daily.   Yes [provider]  Na Sulfate-K Sulfate-Mg Sulf 17.5-3.13-1.6 GM/177ML SOLN As directed 09/08/23  Yes Kamarius Buckbee,  Hennie Duos, DO  acyclovir (ZOVIRAX) 200 MG capsule Take 1 capsule (200 mg total) by mouth 5 (five) times daily. Patient taking differently: Take 200 mg by mouth 5 (five) times daily. As needed 11/30/23   Adline Potter, NP    Allergies as of 01/05/2024 - Review Complete 07/29/2023  Allergen Reaction Noted   Doxycycline Hives and Rash 10/02/2011    Family History  Problem Relation Age of Onset   Diabetes Mother    Hypertension Mother    Hyperlipidemia Mother    Atrial fibrillation Mother    Diabetes Father    Hypertension Father    Hyperlipidemia Father    Heart attack Father        In his 71's   Lung disease Father        copd   Heart disease Maternal Grandmother    Arthritis Maternal Grandmother    Arthritis Maternal Grandfather    Lung disease Maternal Grandfather    Arthritis Paternal Grandmother    Stroke Paternal Grandmother    Arthritis Paternal Grandfather    Lung cancer Paternal Grandfather        worked in [employer]    Social History   Socioeconomic History   Marital status: Married    Spouse name: Not on file   Number of children: Not on file   Years of education: college 4y   Highest education level:  Not on file  Occupational History   Occupation: Dentist: UNEMPLOYED  Tobacco Use   Smoking status: Former    Types: Cigarettes   Smokeless tobacco: Never   Tobacco comments:    Only in highschool  Vaping Use   Vaping status: Never Used  Substance and Sexual Activity   Alcohol use: Yes    Comment: One to two drinks per week   Drug use: No   Sexual activity: Yes    Birth control/protection: Surgical    Comment: vasectomy  Other Topics Concern   Not on file  Social History Narrative   Not on file   Social Drivers of Health   Financial Resource Strain: Low Risk  (01/31/2021)   Overall Financial Resource Strain (CARDIA)    Difficulty of Paying Living Expenses: Not hard at all  Food Insecurity: No Food Insecurity (01/31/2021)   Hunger  Vital Sign    Worried About Running Out of Food in the Last Year: Never true    Ran Out of Food in the Last Year: Never true  Transportation Needs: No Transportation Needs (01/31/2021)   PRAPARE - Administrator, Civil Service (Medical): No    Lack of Transportation (Non-Medical): No  Physical Activity: Sufficiently Active (01/31/2021)   Exercise Vital Sign    Days of Exercise per Week: 3 days    Minutes of Exercise per Session: 60 min  Stress: No Stress Concern Present (01/31/2021)   [employer] of Occupational Health - Occupational Stress Questionnaire    Feeling of Stress : Not at all  Social Connections: Moderately Integrated (01/31/2021)   Social Connection and Isolation Panel [NHANES]    Frequency of Communication with Friends and Family: Three times a week    Frequency of Social Gatherings with Friends and Family: Three times a week    Attends Religious Services: Never    Active Member of Clubs or Organizations: No    Attends Banker Meetings: 1 to 4 times per year    Marital Status: Married  Catering manager Violence: Not At Risk (01/31/2021)   Humiliation, Afraid, Rape, and Kick questionnaire    Fear of Current or Ex-Partner: No    Emotionally Abused: No    Physically Abused: No    Sexually Abused: No    Review of Systems: See HPI, otherwise negative ROS  Physical Exam: Vital signs in last 24 hours: Temp:  [98.2 F (36.8 C)] 98.2 F (36.8 C) (02/17 0719) Pulse Rate:  [68] 68 (02/17 0719) Resp:  [14] 14 (02/17 0719) BP: (124)/(80) 124/80 (02/17 0719) SpO2:  [99 %] 99 % (02/17 0719) Weight:  [81.6 kg] 81.6 kg (02/17 0711)   General:   Alert,  Well-developed, well-nourished, pleasant and cooperative in NAD Head:  Normocephalic and atraumatic. Eyes:  Sclera clear, no icterus.   Conjunctiva pink. Ears:  Normal auditory acuity. Nose:  No deformity, discharge,  or lesions. Msk:  Symmetrical without gross deformities. Normal  posture. Extremities:  Without clubbing or edema. Neurologic:  Alert and  oriented x4;  grossly normal neurologically. Skin:  Intact without significant lesions or rashes. Psych:  Alert and cooperative. Normal mood and affect.  Impression/Plan: Sarah Mason is here for a colonoscopy to be performed for colon cancer screening purposes.  The risks of the procedure including infection, bleed, or perforation as well as benefits, limitations, alternatives and imponderables have been reviewed with the patient. Questions have been answered. All parties agreeable.

## 2024-02-08 NOTE — Discharge Instructions (Signed)
  Colonoscopy Discharge Instructions  Read the instructions outlined below and refer to this sheet in the next few weeks. These discharge instructions provide you with general information on caring for yourself after you leave the hospital. Your doctor may also give you specific instructions. While your treatment has been planned according to the most current medical practices available, unavoidable complications occasionally occur.   ACTIVITY You may resume your regular activity, but move at a slower pace for the next 24 hours.  Take frequent rest periods for the next 24 hours.  Walking will help get rid of the air and reduce the bloated feeling in your belly (abdomen).  No driving for 24 hours (because of the medicine (anesthesia) used during the test).   Do not sign any important legal documents or operate any machinery for 24 hours (because of the anesthesia used during the test).  NUTRITION Drink plenty of fluids.  You may resume your normal diet as instructed by your doctor.  Begin with a light meal and progress to your normal diet. Heavy or fried foods are harder to digest and may make you feel sick to your stomach (nauseated).  Avoid alcoholic beverages for 24 hours or as instructed.  MEDICATIONS You may resume your normal medications unless your doctor tells you otherwise.  WHAT YOU CAN EXPECT TODAY Some feelings of bloating in the abdomen.  Passage of more gas than usual.  Spotting of blood in your stool or on the toilet paper.  IF YOU HAD POLYPS REMOVED DURING THE COLONOSCOPY: No aspirin products for 7 days or as instructed.  No alcohol for 7 days or as instructed.  Eat a soft diet for the next 24 hours.  FINDING OUT THE RESULTS OF YOUR TEST Not all test results are available during your visit. If your test results are not back during the visit, make an appointment with your caregiver to find out the results. Do not assume everything is normal if you have not heard from your  caregiver or the medical facility. It is important for you to follow up on all of your test results.  SEEK IMMEDIATE MEDICAL ATTENTION IF: You have more than a spotting of blood in your stool.  Your belly is swollen (abdominal distention).  You are nauseated or vomiting.  You have a temperature over 101.  You have abdominal pain or discomfort that is severe or gets worse throughout the day.   Your colonoscopy revealed 2 polyp(s) which I removed successfully. Await pathology results, my office will contact you. I recommend repeating colonoscopy in 7 years for surveillance purposes.   You do have internal hemorrhoids. We can discuss banding in the clinic for treatment.   I hope you have a great rest of your week!  Hennie Duos. Marletta Lor, D.O. Gastroenterology and Hepatology [facility]

## 2024-02-08 NOTE — Anesthesia Preprocedure Evaluation (Signed)
Anesthesia Evaluation  Patient identified by MRN, date of birth, ID band Patient awake    Reviewed: Allergy & Precautions, H&P , NPO status , Patient's Chart, lab work & pertinent test results, reviewed documented beta blocker date and time   Airway Mallampati: II  TM Distance: >3 FB Neck ROM: full    Dental no notable dental hx.    Pulmonary asthma , former smoker   Pulmonary exam normal breath sounds clear to auscultation       Cardiovascular Exercise Tolerance: Good hypertension,  Rhythm:regular Rate:Normal     Neuro/Psych negative neurological ROS  negative psych ROS   GI/Hepatic negative GI ROS, Neg liver ROS,,,  Endo/Other  negative endocrine ROS    Renal/GU negative Renal ROS  negative genitourinary   Musculoskeletal   Abdominal   Peds  Hematology negative hematology ROS (+)   Anesthesia Other Findings   Reproductive/Obstetrics negative OB ROS                             Anesthesia Physical Anesthesia Plan  ASA: 2  Anesthesia Plan: General   Post-op Pain Management:    Induction:   PONV Risk Score and Plan: Propofol infusion  Airway Management Planned:   Additional Equipment:   Intra-op Plan:   Post-operative Plan:   Informed Consent: I have reviewed the patients History and Physical, chart, labs and discussed the procedure including the risks, benefits and alternatives for the proposed anesthesia with the patient or authorized representative who has indicated his/her understanding and acceptance.     Dental Advisory Given  Plan Discussed with: CRNA  Anesthesia Plan Comments:        Anesthesia Quick Evaluation

## 2024-02-09 ENCOUNTER — Encounter (HOSPITAL_COMMUNITY): Payer: Self-pay | Admitting: Internal Medicine

## 2024-02-10 LAB — SURGICAL PATHOLOGY

## 2024-02-13 NOTE — Anesthesia Postprocedure Evaluation (Signed)
 Anesthesia Post Note  Patient: Health and safety inspector  Procedure(s) Performed: COLONOSCOPY WITH PROPOFOL POLYPECTOMY  Patient location during evaluation: Phase II Anesthesia Type: General Level of consciousness: awake Pain management: pain level controlled Vital Signs Assessment: post-procedure vital signs reviewed and stable Respiratory status: spontaneous breathing and respiratory function stable Cardiovascular status: blood pressure returned to baseline and stable Postop Assessment: no headache and no apparent nausea or vomiting Anesthetic complications: no Comments: Late entry   No notable events documented.   Last Vitals:  Vitals:   02/08/24 0849 02/08/24 0851  BP: (!) 90/44 103/68  Pulse:    Resp:    Temp:    SpO2:      Last Pain:  Vitals:   02/08/24 0846  TempSrc: Oral  PainSc: 0-No pain                 Windell Norfolk

## 2024-03-09 ENCOUNTER — Other Ambulatory Visit (HOSPITAL_COMMUNITY)
Admission: RE | Admit: 2024-03-09 | Discharge: 2024-03-09 | Disposition: A | Discharge status: Home / Self Care | Source: Ambulatory Visit | Attending: Adult Health | Admitting: Adult Health

## 2024-03-09 ENCOUNTER — Ambulatory Visit (INDEPENDENT_AMBULATORY_CARE_PROVIDER_SITE_OTHER): Payer: 59 | Admitting: Adult Health

## 2024-03-09 ENCOUNTER — Encounter: Payer: Self-pay | Admitting: Adult Health

## 2024-03-09 VITALS — BP 128/83 | HR 68 | Ht 67.0 in | Wt 173.5 lb

## 2024-03-09 DIAGNOSIS — Z01419 Encounter for gynecological examination (general) (routine) without abnormal findings: Secondary | ICD-10-CM | POA: Diagnosis present

## 2024-03-09 DIAGNOSIS — L989 Disorder of the skin and subcutaneous tissue, unspecified: Secondary | ICD-10-CM | POA: Insufficient documentation

## 2024-03-09 DIAGNOSIS — Z1331 Encounter for screening for depression: Secondary | ICD-10-CM

## 2024-03-09 NOTE — Progress Notes (Signed)
 Patient ID: Sarah Mason, female   DOB: 03-25-1961, 63 y.o.   MRN: [id number] History of Present Illness: Sarah Mason is a 63 year old white female, married, PM in for a well woman gyn exam and pap. Has 2 yo granddaughter,[name].  PCP is Dr Adriana Simas   Current Medications, Allergies, Past Medical History, Past Surgical History, Family History and Social History were reviewed in [employer] electronic medical record.     Review of Systems: Patient denies any headaches, hearing loss, fatigue, blurred vision, shortness of breath, chest pain, abdominal pain, problems with bowel movements, urination, or intercourse. No joint pain or mood swings.  Denies any vaginal bleeding   Physical Exam:BP 128/83 (BP Location: Left Arm, Patient Position: Sitting, Cuff Size: Normal)   Pulse 68   Ht 5\' 7"  (1.702 m)   Wt 173 lb 8 oz (78.7 kg)   LMP 12/11/2014 (Approximate)   BMI 27.17 kg/m   General:  Well developed, well nourished, no acute distress Skin:  Warm and dry Neck:  Midline trachea, normal thyroid, good ROM, no lymphadenopathy Lungs; Clear to auscultation bilaterally Breast:  No dominant palpable mass, retraction, or nipple discharge, has about 2.5 cm AK right breast at about 10 0' clock and several small ones both breast Cardiovascular: Regular rate and rhythm Abdomen:  Soft, non tender, no hepatosplenomegaly Pelvic:  External genitalia is normal in appearance, has about 4 mm black slightly raised lesion right vulva, near base, Dr Despina Hidden in for co-exam.  The vagina is pale and atrophic. Urethra has no lesions or masses. The cervix is smooth, pap with HR HPV genotyping performed.  Uterus is felt to be normal size, shape, and contour.  No adnexal masses or tenderness noted.Bladder is non tender, no masses felt. Rectal: Deferred Extremities/musculoskeletal:  No swelling or varicosities noted, no clubbing or cyanosis Psych:  No mood changes, alert and cooperative,seems happy AA is 3 Fall risk is low     03/09/2024    2:48 PM 07/29/2023    2:50 PM 07/30/2022   11:00 AM  Depression screen PHQ 2/9  Decreased Interest 0 0 0  Down, Depressed, Hopeless 0 0 0  PHQ - 2 Score 0 0 0  Altered sleeping 1    Tired, decreased energy 1    Change in appetite 1    Feeling bad or failure about yourself  0    Trouble concentrating 1    Moving slowly or fidgety/restless 0    Suicidal thoughts 0    PHQ-9 Score 4         03/09/2024    2:49 PM 07/29/2023    2:51 PM 01/31/2021   10:46 AM 02/03/2019    3:21 PM  GAD 7 : Generalized Anxiety Score  Nervous, Anxious, on Edge 1 0 0 2  Control/stop worrying 0 0 0 2  Worry too much - different things 1 0 0 3  Trouble relaxing 0 0 0 2  Restless 0 0 0 1  Easily annoyed or irritable 0 0 0 1  Afraid - awful might happen 0 0 0 2  Total GAD 7 Score 2 0 0 13  Anxiety Difficulty    Not difficult at all    Upstream - 03/09/24 1448       Pregnancy Intention Screening   Does the patient want to become pregnant in the next year? No    Does the patient's partner want to become pregnant in the next year? No    Would the patient like  to discuss contraceptive options today? No      Contraception Wrap Up   Current Method Vasectomy   PM   End Method Vasectomy   PM   Contraception Counseling Provided No              Examination chaperoned by Malachy Mood LPN  Impression and plan: 1. Encounter for gynecological examination with Papanicolaou smear of cervix (Primary) Pap sent Pap in 3 years Physical with PCP Labs with PCP Mammogram was negative 03/25/23 Had colonoscopy 02/08/24 tubular adenoma repeat in 7 years  - Cytology - PAP( [place])  2. Skin lesion Has about 4 mm black slightly raised lesion right vulva, near base, Return 03/31/24 at 3:50 pm for removal with Dr Despina Hidden

## 2024-03-11 LAB — CYTOLOGY - PAP
Comment: NEGATIVE
Diagnosis: NEGATIVE
High risk HPV: NEGATIVE

## 2024-03-16 ENCOUNTER — Other Ambulatory Visit: Payer: Self-pay | Admitting: Adult Health

## 2024-03-16 MED ORDER — ESTRADIOL 0.1 MG/GM VA CREA: TOPICAL_CREAM | VAGINAL | 1 refills | Status: DC

## 2024-03-16 NOTE — Progress Notes (Signed)
Rx estrace vaginal cream

## 2024-03-17 ENCOUNTER — Other Ambulatory Visit (HOSPITAL_COMMUNITY): Payer: Self-pay | Admitting: Family Medicine

## 2024-03-17 DIAGNOSIS — Z1231 Encounter for screening mammogram for malignant neoplasm of breast: Secondary | ICD-10-CM

## 2024-03-24 ENCOUNTER — Inpatient Hospital Stay (HOSPITAL_COMMUNITY): Admission: RE | Admit: 2024-03-24 | Source: Ambulatory Visit

## 2024-03-30 ENCOUNTER — Ambulatory Visit (HOSPITAL_COMMUNITY)
Admission: RE | Admit: 2024-03-30 | Discharge: 2024-03-30 | Disposition: A | Discharge status: Home / Self Care | Source: Ambulatory Visit | Attending: Family Medicine | Admitting: Family Medicine

## 2024-03-30 ENCOUNTER — Encounter (HOSPITAL_COMMUNITY): Payer: Self-pay

## 2024-03-30 DIAGNOSIS — Z1231 Encounter for screening mammogram for malignant neoplasm of breast: Secondary | ICD-10-CM | POA: Insufficient documentation

## 2024-03-31 ENCOUNTER — Ambulatory Visit: Admitting: Obstetrics & Gynecology

## 2024-03-31 ENCOUNTER — Encounter: Payer: Self-pay | Admitting: Obstetrics & Gynecology

## 2024-03-31 ENCOUNTER — Other Ambulatory Visit (HOSPITAL_COMMUNITY)
Admission: RE | Admit: 2024-03-31 | Discharge: 2024-03-31 | Disposition: A | Discharge status: Home / Self Care | Source: Ambulatory Visit | Attending: Obstetrics & Gynecology | Admitting: Obstetrics & Gynecology

## 2024-03-31 VITALS — BP 130/82 | HR 74 | Wt 176.0 lb

## 2024-03-31 DIAGNOSIS — N9089 Other specified noninflammatory disorders of vulva and perineum: Secondary | ICD-10-CM | POA: Diagnosis not present

## 2024-03-31 NOTE — Addendum Note (Signed)
 Addended by: Moss Mc on: 03/31/2024 04:47 PM   Modules accepted: Orders

## 2024-03-31 NOTE — Progress Notes (Signed)
 Skin Lesion Excision Procedure Note  Pre-operative Diagnosis: Dysplastic nevus  Post-operative Diagnosis: same  Locations:right  vulva  Indications: new dark lesion  Anesthesia: Lidocaine 2% without epinephrine without added sodium bicarbonate  Procedure Details  History of allergy to iodine: no  Patient informed of the risks (including bleeding and infection) and benefits of the  procedure and Written informed consent obtained.  The lesion and surrounding area was given a sterile prep using betadyne and draped in the usual sterile fashion. An incision was made over the cyst, which was dissected free of the surrounding tissue and removed.  The cyst was filled with typical sebaceous material.  The wound was closed with 3-0 ehtilon suture on FS2 needle using simple interrupted stitches. Antibiotic ointment applied.  The specimen was sent for pathologic examination. The patient tolerated the procedure well.  EBL: 5 ml  Findings: Dark pigmented lesion  Condition: Stable  Complications: none.  Plan: 1. Instructed to keep the wound dry and covered for 24-48h and clean thereafter. 2. Warning signs of infection were reviewed.   3. Recommended that the patient use OTC analgesics as needed for pain.  4. Return for suture removal in  12  days  Lazaro Arms, MD 03/31/2024 4:38 PM .

## 2024-04-05 ENCOUNTER — Encounter: Payer: Self-pay | Admitting: Obstetrics & Gynecology

## 2024-04-05 LAB — SURGICAL PATHOLOGY

## 2024-04-07 ENCOUNTER — Encounter: Payer: Self-pay | Admitting: Internal Medicine

## 2024-04-12 ENCOUNTER — Encounter: Payer: Self-pay | Admitting: Adult Health

## 2024-04-12 ENCOUNTER — Ambulatory Visit: Admitting: Adult Health

## 2024-04-12 VITALS — BP 121/80 | HR 59 | Ht 67.0 in | Wt 173.0 lb

## 2024-04-12 DIAGNOSIS — Z4802 Encounter for removal of sutures: Secondary | ICD-10-CM | POA: Diagnosis not present

## 2024-04-12 NOTE — Progress Notes (Signed)
  Subjective:     Patient ID: Sarah Mason, female   DOB: 27-Jan-1961, 63 y.o.   MRN: [id number]  HPI [name] is a 63 year old white female, married, PM in for suture removal, right labia.     Component Value Date/Time   DIAGPAP  03/09/2024 1451    - Negative for intraepithelial lesion or malignancy (NILM)   DIAGPAP  01/31/2021 1044    - Negative for intraepithelial lesion or malignancy (NILM)   DIAGPAP  06/29/2017 0000    NEGATIVE FOR INTRAEPITHELIAL LESIONS OR MALIGNANCY.   HPVHIGH Negative 03/09/2024 1451   HPVHIGH Negative 01/31/2021 1044   ADEQPAP  03/09/2024 1451    Satisfactory for evaluation; transformation zone component PRESENT.   ADEQPAP  01/31/2021 1044    Satisfactory for evaluation; transformation zone component PRESENT.   ADEQPAP  06/29/2017 0000    Satisfactory for evaluation. The presence or absence of an endocervical / transformation zone component cannot be determined because of atrophy.    PCP is Dr Debrah Fan   Review of Systems For suture removal Reviewed past medical,surgical, social and family history. Reviewed medications and allergies.     Objective:   Physical Exam BP 121/80 (BP Location: Left Arm, Patient Position: Sitting, Cuff Size: Normal)   Pulse (!) 59   Ht 5\' 7"  (1.702 m)   Wt 173 lb (78.5 kg)   LMP 12/11/2014 (Approximate)   BMI 27.10 kg/m     Skin is warm and dry, sutures removed right labia.edges together.    Upstream - 04/12/24 1619       Pregnancy Intention Screening   Does the patient want to become pregnant in the next year? N/A    Does the patient's partner want to become pregnant in the next year? N/A    Would the patient like to discuss contraceptive options today? N/A      Contraception Wrap Up   Current Method Vasectomy    End Method Vasectomy    Contraception Counseling Provided No            Examination chaperoned by Doria Garden NP student   Assessment:     1. Visit for suture removal (Primary) Sutures removed      Plan:     Follow up prn

## 2024-07-26 ENCOUNTER — Ambulatory Visit: Admitting: Gastroenterology

## 2024-07-26 ENCOUNTER — Encounter: Payer: Self-pay | Admitting: Gastroenterology

## 2024-07-26 VITALS — BP 121/80 | HR 60 | Temp 97.8°F | Ht 67.0 in | Wt 173.4 lb

## 2024-07-26 DIAGNOSIS — K640 First degree hemorrhoids: Secondary | ICD-10-CM | POA: Diagnosis not present

## 2024-07-26 NOTE — Progress Notes (Signed)
   [facility] Banding Procedure Note:   Sarah Mason is a 63 y.o. female presenting today for consideration of hemorrhoid banding. Last colonoscopy 02/08/24 with Dr. Cindie revealing nonbleeding internal hemorrhoids, 8 mm polyp in the cecum, and 7 mm polyp in the transverse colon.  She was advised to consider hemorrhoid banding  Latex Allergy: NO  Interval History: Has intermittent flares of hemorrhoids with moe so itching and not really bleeding. She does not want to wait until they get severe so she wanted to consider banding.  She denies any intermittent constipation, average time of the commode is about 2 minutes.  She does report some history of external hemorrhoids and does not take any fiber supplements but does try to follow a high-fiber diet with eating bran cereal.  Notes that she had kids vaginally without epidural and believes she has had some present hemorrhoids ever since child bearing.  The patient presents with symptomatic grade 1 hemorrhoids, unresponsive to maximal medical therapy, requesting rubber band ligation of his/her hemorrhoidal disease. All risks, benefits, and alternative forms of therapy were described and informed consent was obtained.  In the left lateral decubitus position anoscopic examination revealed grade 1 hemorrhoids in the right posterior and right anterior positions.  The decision was made to band the right posterior internal hemorrhoid, and the [facility] O'Regan System was used to perform band ligation without complication. Digital anorectal examination was then performed to assure proper positioning of the band, and to adjust the banded tissue as required. The patient was discharged home without pain or other issues. Dietary and behavioral recommendations were given along with follow-up instructions. The patient will return in 2-3 weeks for additional banding as required.  No complications were encountered and the patient tolerated the procedure well.    Sarah Melia,  MSN, FNP-BC, AGACNP-BC [facility]

## 2024-07-26 NOTE — Patient Instructions (Signed)
 Continue to avoid straining. Limit toilet time to 2-3 minutes at the most.  Avoid constipation. Take 2 tablespoons of natural wheat bran, natural oat bran, flax, Benefiber or any over the counter fiber supplement and increase your water  intake to 7-8 glasses daily if needed.   Occasionally, you may have more bleeding than usual after the banding procedure. This is often from the untreated hemorrhoids rather than the treated one. Don't be concerned if there is a tablespoon or so of blood. If there is more blood than this, lie flat with your bottom higher than your head and apply an ice pack to the area. If the bleeding does not stop within a half an hour or if you feel faint, have severe pain, chills, fever or difficulty passing urine (very rare) or other problems, you should call us  at [phone] or report to the nearest emergency room. Please call me with any concerns!  The procedure you have had should have been relatively painless since the banding of the area involved does not have nerve endings and there is no pain sensation. The rubber band cuts off the blood supply to the hemorrhoid and the band may fall off as soon as 48 hours after the banding (the band may occasionally be seen in the toilet bowl following a bowel movement). You may notice a temporary feeling of fullness in the rectum which should respond adequately to plain Tylenol  or Motrin .  I will see you back in 2-3 weeks or at your convenience for additional banding.  Charmaine Melia, MSN, FNP-BC, AGACNP-BC [facility]

## 2024-07-28 ENCOUNTER — Encounter: Payer: Self-pay | Admitting: Family Medicine

## 2024-07-28 ENCOUNTER — Ambulatory Visit: Admitting: Family Medicine

## 2024-07-28 VITALS — BP 104/72 | HR 60 | Temp 97.6°F | Ht 67.0 in | Wt 171.0 lb

## 2024-07-28 DIAGNOSIS — L255 Unspecified contact dermatitis due to plants, except food: Secondary | ICD-10-CM

## 2024-07-28 DIAGNOSIS — I10 Essential (primary) hypertension: Secondary | ICD-10-CM | POA: Diagnosis not present

## 2024-07-28 DIAGNOSIS — E7800 Pure hypercholesterolemia, unspecified: Secondary | ICD-10-CM

## 2024-07-28 DIAGNOSIS — Z0001 Encounter for general adult medical examination with abnormal findings: Secondary | ICD-10-CM | POA: Diagnosis not present

## 2024-07-28 DIAGNOSIS — Z130 Encounter for screening for diseases of the blood and blood-forming organs and certain disorders involving the immune mechanism: Secondary | ICD-10-CM

## 2024-07-28 DIAGNOSIS — Z0000 Encounter for general adult medical examination without abnormal findings: Secondary | ICD-10-CM | POA: Insufficient documentation

## 2024-07-28 MED ORDER — AMLODIPINE BESYLATE 5 MG PO TABS
ORAL_TABLET | ORAL | 3 refills | Status: AC
Start: 2024-07-28 — End: ?

## 2024-07-28 MED ORDER — ENALAPRIL MALEATE 20 MG PO TABS: ORAL_TABLET | ORAL | 3 refills | Status: AC

## 2024-07-28 MED ORDER — PREDNISONE 10 MG PO TABS: ORAL_TABLET | ORAL | 0 refills | Status: AC

## 2024-07-28 NOTE — Assessment & Plan Note (Signed)
 Preventative health care is up-to-date excluding influenza vaccine which we do not yet have.  Labs today.  Blood pressure medications refilled.

## 2024-07-28 NOTE — Patient Instructions (Signed)
 Labs at your convenience.  I sent in the prednisone .  Follow up in 6 months.

## 2024-07-28 NOTE — Progress Notes (Signed)
 Subjective:  Patient ID: Sarah Mason, female    DOB: [birth year]  Age: 63 y.o. MRN: [id number]  CC:   Chief Complaint  Patient presents with   Annual Exam    HPI:  63 year old female presents for an annual exam.  Hypertension is well-controlled.  She is compliant with amlodipine  and enalapril .  Needs refills.  Patient needs her routine lab work.  Not currently on any treatment for hyperlipidemia.  Patient reports that she has been experiencing a rash for the past 2 weeks.  Located on the arms as well as the chest.  Itchy.  Raised.  Patient concerned that she may have poison oak or poison ivy.  Patient's preventative healthcare items have been addressed and updated in the chart.  She states that she has had pneumococcal vaccine as well as shingles vaccine.  We just need to get records of this from her pharmacy.  Breast cancer screening, colon cancer screening, and cervical cancer screening up-to-date.  We do not yet have any flu vaccines.  Patient Active Problem List   Diagnosis Date Noted   Annual physical exam 07/28/2024   Dermatitis due to plants, including poison ivy, sumac, and oak 07/28/2024   Hyperlipidemia 07/30/2022   Essential hypertension 01/13/2015   Exercise-induced asthma 09/08/2012    Social Hx   Social History   Socioeconomic History   Marital status: Married    Spouse name: Not on file   Number of children: Not on file   Years of education: college 4y   Highest education level: Bachelor's degree (e.g., BA, AB, BS)  Occupational History   Occupation: Dentist: UNEMPLOYED  Tobacco Use   Smoking status: Former    Types: Cigarettes   Smokeless tobacco: Never   Tobacco comments:    Only in highschool  Vaping Use   Vaping status: Never Used  Substance and Sexual Activity   Alcohol use: Yes    Comment: One to two drinks per week   Drug use: No   Sexual activity: Yes    Birth control/protection: Surgical    Comment: vasectomy  Other Topics  Concern   Not on file  Social History Narrative   Not on file   Social Drivers of Health   Financial Resource Strain: Low Risk  (07/24/2024)   Overall Financial Resource Strain (CARDIA)    Difficulty of Paying Living Expenses: Not hard at all  Food Insecurity: No Food Insecurity (07/24/2024)   Hunger Vital Sign    Worried About Running Out of Food in the Last Year: Never true    Ran Out of Food in the Last Year: Never true  Transportation Needs: No Transportation Needs (07/24/2024)   PRAPARE - Administrator, Civil Service (Medical): No    Lack of Transportation (Non-Medical): No  Physical Activity: Insufficiently Active (07/24/2024)   Exercise Vital Sign    Days of Exercise per Week: 3 days    Minutes of Exercise per Session: 30 min  Stress: Stress Concern Present (07/24/2024)   [employer] of Occupational Health - Occupational Stress Questionnaire    Feeling of Stress: To some extent  Social Connections: Moderately Isolated (07/24/2024)   Social Connection and Isolation Panel    Frequency of Communication with Friends and Family: More than three times a week    Frequency of Social Gatherings with Friends and Family: Three times a week    Attends Religious Services: Patient declined    Active Member of Clubs or Organizations:  No    Attends Banker Meetings: Not on file    Marital Status: Married    Review of Systems Per HPI  Objective:  BP 104/72   Pulse 60   Temp 97.6 F (36.4 C)   Ht 5' 7 (1.702 m)   Wt 171 lb (77.6 kg)   LMP 12/11/2014 (Approximate)   SpO2 97%   BMI 26.78 kg/m      07/28/2024    8:19 AM 07/26/2024   10:22 AM 04/12/2024    4:17 PM  BP/Weight  Systolic BP 104 121 121  Diastolic BP 72 80 80  Wt. (Lbs) 171 173.4 173  BMI 26.78 kg/m2 27.16 kg/m2 27.1 kg/m2    Physical Exam Vitals and nursing note reviewed.  Constitutional:      General: She is not in acute distress.    Appearance: Normal appearance.  HENT:     Head:  Normocephalic and atraumatic.  Eyes:     General:        Right eye: No discharge.        Left eye: No discharge.     Conjunctiva/sclera: Conjunctivae normal.  Cardiovascular:     Rate and Rhythm: Normal rate and regular rhythm.  Pulmonary:     Effort: Pulmonary effort is normal.     Breath sounds: Normal breath sounds. No wheezing, rhonchi or rales.  Skin:    Comments: Upper extremities and center of the chest with erythematous, papular/vesicular rash.  Neurological:     Mental Status: She is alert.  Psychiatric:        Mood and Affect: Mood normal.        Behavior: Behavior normal.     Lab Results  Component Value Date   WBC 3.7 08/12/2023   HGB 14.5 08/12/2023   HCT 44.1 08/12/2023   PLT 208 08/12/2023   GLUCOSE 103 (H) 08/12/2023   CHOL 210 (H) 08/12/2023   TRIG 159 (H) 08/12/2023   HDL 44 08/12/2023   LDLCALC 137 (H) 08/12/2023   ALT 13 08/12/2023   AST 25 08/12/2023   NA 141 08/12/2023   K 4.1 08/12/2023   CL 102 08/12/2023   CREATININE 0.82 08/12/2023   BUN 15 08/12/2023   CO2 23 08/12/2023   TSH 1.330 06/11/2022   HGBA1C 5.3 11/12/2021     Assessment & Plan:  Annual physical exam Assessment & Plan: Preventative health care is up-to-date excluding influenza vaccine which we do not yet have.  Labs today.  Blood pressure medications refilled.   Essential hypertension -     amLODIPine  Besylate; TAKE 1 TABLET(5 MG) BY MOUTH DAILY  Dispense: 90 tablet; Refill: 3 -     Enalapril  Maleate; TAKE 1 TABLET(20 MG) BY MOUTH TWICE DAILY  Dispense: 180 tablet; Refill: 3 -     CMP14+EGFR  Pure hypercholesterolemia -     Lipid panel  Screening for deficiency anemia -     CBC  Dermatitis due to plants, including poison ivy, sumac, and oak Assessment & Plan: Rx sent for prednisone .  Orders: -     predniSONE ; 50 mg daily x 3 days, then 40 mg daily x 3 days, then 30 mg daily x 3 days, then 20 mg daily x 3 days, then 10 mg daily x 3 days.  Dispense: 45 tablet;  Refill: 0    Follow-up:  6 months  Emmerie Battaglia Bluford DO [facility]

## 2024-07-28 NOTE — Assessment & Plan Note (Signed)
 Rx sent for prednisone.

## 2024-08-05 LAB — CMP14+EGFR
ALT: 25 IU/L (ref 0–32)
AST: 26 IU/L (ref 0–40)
Albumin: 4.4 g/dL (ref 3.9–4.9)
Alkaline Phosphatase: 78 IU/L (ref 44–121)
BUN/Creatinine Ratio: 18 (ref 12–28)
BUN: 16 mg/dL (ref 8–27)
Bilirubin Total: 0.5 mg/dL (ref 0.0–1.2)
CO2: 24 mmol/L (ref 20–29)
Calcium: 9.2 mg/dL (ref 8.7–10.3)
Chloride: 100 mmol/L (ref 96–106)
Creatinine, Ser: 0.89 mg/dL (ref 0.57–1.00)
Globulin, Total: 2.2 g/dL (ref 1.5–4.5)
Glucose: 96 mg/dL (ref 70–99)
Potassium: 3.8 mmol/L (ref 3.5–5.2)
Sodium: 140 mmol/L (ref 134–144)
Total Protein: 6.6 g/dL (ref 6.0–8.5)
eGFR: 73 mL/min/1.73 (ref >59)

## 2024-08-05 LAB — CBC
Hematocrit: 47.1 % — ABNORMAL HIGH (ref 34.0–46.6)
Hemoglobin: 15.1 g/dL (ref 11.1–15.9)
MCH: 28.2 pg (ref 26.6–33.0)
MCHC: 32.1 g/dL (ref 31.5–35.7)
MCV: 88 fL (ref 79–97)
Platelets: 196 x10E3/uL (ref 150–450)
RBC: 5.36 x10E6/uL — ABNORMAL HIGH (ref 3.77–5.28)
RDW: 12.6 % (ref 11.7–15.4)
WBC: 6.2 x10E3/uL (ref 3.4–10.8)

## 2024-08-05 LAB — LIPID PANEL
Chol/HDL Ratio: 3.4 ratio (ref 0.0–4.4)
Cholesterol, Total: 192 mg/dL (ref 100–199)
HDL: 57 mg/dL (ref >39)
LDL Chol Calc (NIH): 113 mg/dL — ABNORMAL HIGH (ref 0–99)
Triglycerides: 127 mg/dL (ref 0–149)
VLDL Cholesterol Cal: 22 mg/dL (ref 5–40)

## 2024-08-08 ENCOUNTER — Ambulatory Visit: Payer: Self-pay | Admitting: Family Medicine

## 2024-09-27 ENCOUNTER — Ambulatory Visit: Admitting: Gastroenterology

## 2024-09-27 ENCOUNTER — Encounter: Payer: Self-pay | Admitting: Gastroenterology

## 2024-09-27 VITALS — BP 128/85 | HR 74 | Temp 96.9°F | Ht 67.0 in | Wt 175.4 lb

## 2024-09-27 DIAGNOSIS — K640 First degree hemorrhoids: Secondary | ICD-10-CM

## 2024-09-27 NOTE — Progress Notes (Signed)
   [facility] Banding Procedure Note:   Sarah Mason is a 63 y.o. female presenting today for consideration of hemorrhoid banding. Last colonoscopy 02/08/24 with Dr. Cindie revealing nonbleeding internal hemorrhoids, 8 mm polyp in the cecum, and 7 mm polyp in the transverse colon.  She was advised to consider hemorrhoid banding .  Latex Allergy: NO  Interval History:  right posterior hemorrhoid column banded last visit 07/26/2024. No recent bleeding, itching, or pain. [place]  The patient presents with symptomatic grade 1 hemorrhoids, unresponsive to maximal medical therapy, requesting rubber band ligation of his/her hemorrhoidal disease. All risks, benefits, and alternative forms of therapy were described and informed consent was obtained.  The decision was made to band the right anterior internal hemorrhoid, and the [facility] O'Regan System was used to perform band ligation without complication. Digital anorectal examination was then performed to assure proper positioning of the band, and to adjust the banded tissue as required. The patient was discharged home without pain or other issues. Dietary and behavioral recommendations were given along with follow-up instructions. The patient will return in 2-3 weeks for additional banding as required.  No complications were encountered and the patient tolerated the procedure well.    Charmaine Melia, MSN, FNP-BC, AGACNP-BC [facility]

## 2024-09-27 NOTE — Patient Instructions (Signed)
Continue to avoid straining. Limit toilet time to 2-3 minutes at the most.   Avoid constipation. Take 2 tablespoons of natural wheat bran, natural oat bran, flax, Benefiber or any over the counter fiber supplement and increase your water intake to 7-8 glasses daily.  Occasionally, you may have more bleeding than usual after the banding procedure. This is often from the untreated hemorrhoids rather than the treated one. Don't be concerned if there is a tablespoon or so of blood. If there is more blood than this, lie flat with your bottom higher than your head and apply an ice pack to the area. If the bleeding does not stop within a half an hour or if you feel faint, have severe pain, chills, fever or difficulty passing urine (very rare) or other problems, you should call us at [phone] or report to the nearest emergency room. Please call me with any concerns!  The procedure you have had should have been relatively painless since the banding of the area involved does not have nerve endings and there is no pain sensation. The rubber band cuts off the blood supply to the hemorrhoid and the band may fall off as soon as 48 hours after the banding (the band may occasionally be seen in the toilet bowl following a bowel movement). You may notice a temporary feeling of fullness in the rectum which should respond adequately to plain Tylenol or Motrin.  I will see you back in 2-3 weeks for additional banding.   Brooke Bonito, MSN, FNP-BC, AGACNP-BC [facility]

## 2024-11-02 ENCOUNTER — Encounter: Payer: Self-pay | Admitting: Gastroenterology

## 2024-11-02 ENCOUNTER — Ambulatory Visit: Admitting: Gastroenterology

## 2024-11-02 VITALS — BP 136/86 | HR 64 | Temp 97.7°F | Ht 67.0 in | Wt 174.6 lb

## 2024-11-02 DIAGNOSIS — K640 First degree hemorrhoids: Secondary | ICD-10-CM | POA: Diagnosis not present

## 2024-11-02 NOTE — Patient Instructions (Signed)
 Continue to avoid straining. Limit toilet time to 2-3 minutes at the most.   Avoid constipation. Take 2 tablespoons of natural wheat bran, natural oat bran, flax, Benefiber or any over the counter fiber supplement and increase your water  intake to 7-8 glasses daily.  Occasionally, you may have more bleeding than usual after the banding procedure. This is often from the untreated hemorrhoids rather than the treated one. Don't be concerned if there is a tablespoon or so of blood. If there is more blood than this, lie flat with your bottom higher than your head and apply an ice pack to the area. If the bleeding does not stop within a half an hour or if you feel faint, have severe pain, chills, fever or difficulty passing urine (very rare) or other problems, you should call us  at [phone] or report to the nearest emergency room. Please call me with any concerns!  The procedure you have had should have been relatively painless since the banding of the area involved does not have nerve endings and there is no pain sensation. The rubber band cuts off the blood supply to the hemorrhoid and the band may fall off as soon as 48 hours after the banding (the band may occasionally be seen in the toilet bowl following a bowel movement). You may notice a temporary feeling of fullness in the rectum which should respond adequately to plain Tylenol  or Motrin .  I will see you back as needed. If you have improvement in some of your external tissue in regards to it being smaller after most recent banding then we could do another neutral band to help with mucosal tightening. Feel free to make appointment if you would like.   Charmaine Melia, MSN, FNP-BC, AGACNP-BC [facility]

## 2024-11-02 NOTE — Progress Notes (Signed)
   [facility] Banding Procedure Note:   Sarah Mason is a 63 y.o. female presenting today for consideration of hemorrhoid banding. Last colonoscopy 02/08/24 with Dr. Cindie revealing nonbleeding internal hemorrhoids, 8 mm polyp in the cecum, and 7 mm polyp in the transverse colon.  Latex Allergy: NO  Interval History: Has underwent banding to the right anterior and right posterior hemorrhoid columns in August and October. Has not had any rectal pain or bleeding. No constipation or abdominal pain.   The patient presents with symptomatic grade 1 hemorrhoids, unresponsive to maximal medical therapy, requesting rubber band ligation of his/her hemorrhoidal disease. All risks, benefits, and alternative forms of therapy were described and informed consent was obtained.  The decision was made to band the left lateral internal hemorrhoid, and the [facility] System was used to perform band ligation without complication. Digital anorectal examination was then performed to assure proper positioning of the band, and to adjust the banded tissue as required. The patient was discharged home without pain or other issues. Dietary and behavioral recommendations were given and (if necessary prescriptions were given), along with follow-up instructions. The patient will return as needed. Discussed that if she has improvement in some of her external tissue in regards to it being smaller after most recent banding then we could do another neutral band to help with mucosal tightening.  No complications were encountered and the patient tolerated the procedure well.   Charmaine Melia, MSN, FNP-BC, AGACNP-BC [facility]

## 2024-11-25 ENCOUNTER — Other Ambulatory Visit: Payer: Self-pay | Admitting: Adult Health

## 2025-01-27 ENCOUNTER — Ambulatory Visit: Admitting: Family Medicine

## 2025-01-30 ENCOUNTER — Ambulatory Visit: Admitting: Family Medicine
